# Patient Record
Sex: Male | Born: 1975 | Race: Black or African American | Hispanic: No | Smoking: Never smoker
Health system: Southern US, Community
[De-identification: ages and names within clinical notes are randomized; demographics above are authoritative.]

## PROBLEM LIST (undated history)

## (undated) DIAGNOSIS — E119 Type 2 diabetes mellitus without complications: Secondary | ICD-10-CM

---

## 2014-08-07 ENCOUNTER — Inpatient Hospital Stay (HOSPITAL_COMMUNITY): Payer: Non-veteran care

## 2014-08-07 ENCOUNTER — Emergency Department (HOSPITAL_COMMUNITY): Payer: Non-veteran care

## 2014-08-07 ENCOUNTER — Inpatient Hospital Stay (HOSPITAL_COMMUNITY)
Admission: EM | Admit: 2014-08-07 | Discharge: 2014-08-15 | DRG: 086 | Disposition: A | Discharge status: Rehabilitation Facility | Payer: Non-veteran care | Attending: General Surgery | Admitting: General Surgery

## 2014-08-07 ENCOUNTER — Encounter (HOSPITAL_COMMUNITY): Payer: Self-pay | Admitting: Emergency Medicine

## 2014-08-07 DIAGNOSIS — T796XXD Traumatic ischemia of muscle, subsequent encounter: Secondary | ICD-10-CM

## 2014-08-07 DIAGNOSIS — E10649 Type 1 diabetes mellitus with hypoglycemia without coma: Secondary | ICD-10-CM | POA: Diagnosis present

## 2014-08-07 DIAGNOSIS — M6282 Rhabdomyolysis: Secondary | ICD-10-CM | POA: Diagnosis present

## 2014-08-07 DIAGNOSIS — R402143 Coma scale, eyes open, spontaneous, at hospital admission: Secondary | ICD-10-CM | POA: Diagnosis present

## 2014-08-07 DIAGNOSIS — S065X9A Traumatic subdural hemorrhage with loss of consciousness of unspecified duration, initial encounter: Secondary | ICD-10-CM | POA: Diagnosis not present

## 2014-08-07 DIAGNOSIS — S069X4S Unspecified intracranial injury with loss of consciousness of 6 hours to 24 hours, sequela: Secondary | ICD-10-CM | POA: Diagnosis not present

## 2014-08-07 DIAGNOSIS — Y900 Blood alcohol level of less than 20 mg/100 ml: Secondary | ICD-10-CM | POA: Diagnosis present

## 2014-08-07 DIAGNOSIS — I619 Nontraumatic intracerebral hemorrhage, unspecified: Secondary | ICD-10-CM | POA: Diagnosis not present

## 2014-08-07 DIAGNOSIS — E109 Type 1 diabetes mellitus without complications: Secondary | ICD-10-CM

## 2014-08-07 DIAGNOSIS — S065XAA Traumatic subdural hemorrhage with loss of consciousness status unknown, initial encounter: Secondary | ICD-10-CM | POA: Diagnosis not present

## 2014-08-07 DIAGNOSIS — Z781 Physical restraint status: Secondary | ICD-10-CM | POA: Diagnosis not present

## 2014-08-07 DIAGNOSIS — H748X1 Other specified disorders of right middle ear and mastoid: Secondary | ICD-10-CM

## 2014-08-07 DIAGNOSIS — S0210XA Unspecified fracture of base of skull, initial encounter for closed fracture: Secondary | ICD-10-CM

## 2014-08-07 DIAGNOSIS — S0291XA Unspecified fracture of skull, initial encounter for closed fracture: Secondary | ICD-10-CM | POA: Diagnosis present

## 2014-08-07 DIAGNOSIS — E876 Hypokalemia: Secondary | ICD-10-CM | POA: Diagnosis present

## 2014-08-07 DIAGNOSIS — R32 Unspecified urinary incontinence: Secondary | ICD-10-CM | POA: Diagnosis present

## 2014-08-07 DIAGNOSIS — E872 Acidosis, unspecified: Secondary | ICD-10-CM

## 2014-08-07 DIAGNOSIS — R40243 Glasgow coma scale score 3-8: Secondary | ICD-10-CM | POA: Diagnosis present

## 2014-08-07 DIAGNOSIS — S066X1A Traumatic subarachnoid hemorrhage with loss of consciousness of 30 minutes or less, initial encounter: Secondary | ICD-10-CM | POA: Diagnosis present

## 2014-08-07 DIAGNOSIS — S069X4A Unspecified intracranial injury with loss of consciousness of 6 hours to 24 hours, initial encounter: Secondary | ICD-10-CM | POA: Diagnosis not present

## 2014-08-07 DIAGNOSIS — Z794 Long term (current) use of insulin: Secondary | ICD-10-CM | POA: Diagnosis not present

## 2014-08-07 DIAGNOSIS — S069X9A Unspecified intracranial injury with loss of consciousness of unspecified duration, initial encounter: Secondary | ICD-10-CM | POA: Diagnosis present

## 2014-08-07 DIAGNOSIS — S060X1A Concussion with loss of consciousness of 30 minutes or less, initial encounter: Secondary | ICD-10-CM

## 2014-08-07 DIAGNOSIS — E119 Type 2 diabetes mellitus without complications: Secondary | ICD-10-CM | POA: Diagnosis not present

## 2014-08-07 DIAGNOSIS — S0210XS Unspecified fracture of base of skull, sequela: Secondary | ICD-10-CM | POA: Diagnosis not present

## 2014-08-07 DIAGNOSIS — R402213 Coma scale, best verbal response, none, at hospital admission: Secondary | ICD-10-CM | POA: Diagnosis present

## 2014-08-07 DIAGNOSIS — F1290 Cannabis use, unspecified, uncomplicated: Secondary | ICD-10-CM | POA: Diagnosis present

## 2014-08-07 DIAGNOSIS — S02109A Fracture of base of skull, unspecified side, initial encounter for closed fracture: Secondary | ICD-10-CM | POA: Diagnosis present

## 2014-08-07 DIAGNOSIS — S0003XA Contusion of scalp, initial encounter: Secondary | ICD-10-CM | POA: Diagnosis present

## 2014-08-07 DIAGNOSIS — D62 Acute posthemorrhagic anemia: Secondary | ICD-10-CM | POA: Diagnosis present

## 2014-08-07 DIAGNOSIS — F1010 Alcohol abuse, uncomplicated: Secondary | ICD-10-CM | POA: Diagnosis present

## 2014-08-07 DIAGNOSIS — M5416 Radiculopathy, lumbar region: Secondary | ICD-10-CM | POA: Diagnosis not present

## 2014-08-07 DIAGNOSIS — S069XAA Unspecified intracranial injury with loss of consciousness status unknown, initial encounter: Secondary | ICD-10-CM | POA: Diagnosis present

## 2014-08-07 DIAGNOSIS — Z23 Encounter for immunization: Secondary | ICD-10-CM

## 2014-08-07 HISTORY — DX: Type 2 diabetes mellitus without complications: E11.9

## 2014-08-07 LAB — COMPREHENSIVE METABOLIC PANEL
ALT: 23 U/L (ref 0–53)
ANION GAP: 15 (ref 5–15)
AST: 29 U/L (ref 0–37)
Albumin: 4.2 g/dL (ref 3.5–5.2)
Alkaline Phosphatase: 86 U/L (ref 39–117)
BILIRUBIN TOTAL: 0.8 mg/dL (ref 0.3–1.2)
BUN: 17 mg/dL (ref 6–23)
CHLORIDE: 96 meq/L (ref 96–112)
CO2: 26 meq/L (ref 19–32)
Calcium: 9.2 mg/dL (ref 8.4–10.5)
Creatinine, Ser: 1.24 mg/dL (ref 0.50–1.35)
GFR, EST AFRICAN AMERICAN: 84 mL/min — AB (ref >90)
GFR, EST NON AFRICAN AMERICAN: 73 mL/min — AB (ref >90)
GLUCOSE: 262 mg/dL — AB (ref 70–99)
POTASSIUM: 3.4 meq/L — AB (ref 3.7–5.3)
Sodium: 137 mEq/L (ref 137–147)
Total Protein: 7.5 g/dL (ref 6.0–8.3)

## 2014-08-07 LAB — I-STAT CHEM 8, ED
BUN: 17 mg/dL (ref 6–23)
CHLORIDE: 100 meq/L (ref 96–112)
Calcium, Ion: 1.18 mmol/L (ref 1.12–1.23)
Creatinine, Ser: 1.3 mg/dL (ref 0.50–1.35)
GLUCOSE: 264 mg/dL — AB (ref 70–99)
HEMATOCRIT: 41 % (ref 39.0–52.0)
Hemoglobin: 13.9 g/dL (ref 13.0–17.0)
POTASSIUM: 3.3 meq/L — AB (ref 3.7–5.3)
Sodium: 138 mEq/L (ref 137–147)
TCO2: 23 mmol/L (ref 0–100)

## 2014-08-07 LAB — PROTIME-INR
INR: 1.14 (ref 0.00–1.49)
PROTHROMBIN TIME: 14.6 s (ref 11.6–15.2)

## 2014-08-07 LAB — CBC
HCT: 34.2 % — ABNORMAL LOW (ref 39.0–52.0)
HCT: 33.4 % — ABNORMAL LOW (ref 39.0–52.0)
HEMOGLOBIN: 11.3 g/dL — AB (ref 13.0–17.0)
Hemoglobin: 11.5 g/dL — ABNORMAL LOW (ref 13.0–17.0)
MCH: 24.2 pg — ABNORMAL LOW (ref 26.0–34.0)
MCH: 24.5 pg — AB (ref 26.0–34.0)
MCHC: 33.6 g/dL (ref 30.0–36.0)
MCHC: 33.8 g/dL (ref 30.0–36.0)
MCV: 72 fL — ABNORMAL LOW (ref 78.0–100.0)
MCV: 72.3 fL — ABNORMAL LOW (ref 78.0–100.0)
PLATELETS: 256 10*3/uL (ref 150–400)
Platelets: 273 10*3/uL (ref 150–400)
RBC: 4.75 MIL/uL (ref 4.22–5.81)
RBC: 4.62 MIL/uL (ref 4.22–5.81)
RDW: 13 % (ref 11.5–15.5)
RDW: 13 % (ref 11.5–15.5)
WBC: 14.2 10*3/uL — ABNORMAL HIGH (ref 4.0–10.5)
WBC: 16.8 10*3/uL — AB (ref 4.0–10.5)

## 2014-08-07 LAB — BASIC METABOLIC PANEL
ANION GAP: 17 — AB (ref 5–15)
BUN: 14 mg/dL (ref 6–23)
CHLORIDE: 96 meq/L (ref 96–112)
CO2: 25 meq/L (ref 19–32)
Calcium: 9.2 mg/dL (ref 8.4–10.5)
Creatinine, Ser: 0.89 mg/dL (ref 0.50–1.35)
GFR calc Af Amer: >90 mL/min (ref >90)
GFR calc non Af Amer: >90 mL/min (ref >90)
GLUCOSE: 97 mg/dL (ref 70–99)
POTASSIUM: 3.2 meq/L — AB (ref 3.7–5.3)
SODIUM: 138 meq/L (ref 137–147)

## 2014-08-07 LAB — CBG MONITORING, ED
GLUCOSE-CAPILLARY: 140 mg/dL — AB (ref 70–99)
Glucose-Capillary: 224 mg/dL — ABNORMAL HIGH (ref 70–99)
Glucose-Capillary: 277 mg/dL — ABNORMAL HIGH (ref 70–99)
Glucose-Capillary: 273 mg/dL — ABNORMAL HIGH (ref 70–99)

## 2014-08-07 LAB — TSH: TSH: 0.615 u[IU]/mL (ref 0.350–4.500)

## 2014-08-07 LAB — GLUCOSE, CAPILLARY
GLUCOSE-CAPILLARY: 177 mg/dL — AB (ref 70–99)
GLUCOSE-CAPILLARY: 144 mg/dL — AB (ref 70–99)
Glucose-Capillary: 124 mg/dL — ABNORMAL HIGH (ref 70–99)

## 2014-08-07 LAB — LACTIC ACID, PLASMA: Lactic Acid, Venous: 5.7 mmol/L — ABNORMAL HIGH (ref 0.5–2.2)

## 2014-08-07 LAB — ETHANOL: ALCOHOL ETHYL (B): 16 mg/dL — AB (ref 0–11)

## 2014-08-07 LAB — I-STAT TROPONIN, ED: TROPONIN I, POC: 0.01 ng/mL (ref 0.00–0.08)

## 2014-08-07 LAB — CK: CK TOTAL: 728 U/L — AB (ref 7–232)

## 2014-08-07 LAB — MAGNESIUM: MAGNESIUM: 1.6 mg/dL (ref 1.5–2.5)

## 2014-08-07 LAB — I-STAT CG4 LACTIC ACID, ED: LACTIC ACID, VENOUS: 3.66 mmol/L — AB (ref 0.5–2.2)

## 2014-08-07 LAB — SAMPLE TO BLOOD BANK

## 2014-08-07 LAB — CDS SEROLOGY

## 2014-08-07 LAB — MRSA PCR SCREENING: MRSA BY PCR: NEGATIVE

## 2014-08-07 MED ORDER — ONDANSETRON HCL 4 MG/2ML IJ SOLN
INTRAMUSCULAR | Status: AC
  Administered 2014-08-07: 4 mg
  Filled 2014-08-07: qty 2

## 2014-08-07 MED ORDER — ONDANSETRON HCL 4 MG/2ML IJ SOLN
4.0000 mg | Freq: Four times a day (QID) | INTRAMUSCULAR | Status: DC | PRN
  Administered 2014-08-07: 4 mg via INTRAVENOUS
  Filled 2014-08-07: qty 2

## 2014-08-07 MED ORDER — DEXTROSE 50 % IV SOLN: 50.0000 mL | Freq: Once | INTRAVENOUS | Status: AC | PRN

## 2014-08-07 MED ORDER — PANTOPRAZOLE SODIUM 40 MG PO TBEC: 40.0000 mg | DELAYED_RELEASE_TABLET | Freq: Every day | ORAL | Status: DC

## 2014-08-07 MED ORDER — LORAZEPAM 2 MG/ML IJ SOLN
1.0000 mg | INTRAMUSCULAR | Status: DC | PRN
  Administered 2014-08-07 (×2): 1 mg via INTRAVENOUS
  Filled 2014-08-07 (×2): qty 1

## 2014-08-07 MED ORDER — INSULIN ASPART 100 UNIT/ML ~~LOC~~ SOLN
0.0000 [IU] | SUBCUTANEOUS | Status: DC
  Administered 2014-08-07: 2 [IU] via SUBCUTANEOUS
  Administered 2014-08-07: 8 [IU] via SUBCUTANEOUS
  Filled 2014-08-07 (×2): qty 1

## 2014-08-07 MED ORDER — POTASSIUM CHLORIDE 2 MEQ/ML IV SOLN
INTRAVENOUS | Status: DC
  Administered 2014-08-07 – 2014-08-10 (×3): via INTRAVENOUS
  Filled 2014-08-07 (×7): qty 1000

## 2014-08-07 MED ORDER — LORAZEPAM 1 MG PO TABS: 1.0000 mg | ORAL_TABLET | Freq: Four times a day (QID) | ORAL | Status: AC | PRN

## 2014-08-07 MED ORDER — LORAZEPAM 2 MG/ML IJ SOLN
1.0000 mg | INTRAMUSCULAR | Status: DC | PRN
  Administered 2014-08-13 – 2014-08-15 (×4): 1 mg via INTRAVENOUS
  Filled 2014-08-07 (×4): qty 1

## 2014-08-07 MED ORDER — VITAMIN B-1 100 MG PO TABS
100.0000 mg | ORAL_TABLET | Freq: Every day | ORAL | Status: DC
  Administered 2014-08-10 – 2014-08-15 (×6): 100 mg via ORAL
  Filled 2014-08-07 (×9): qty 1

## 2014-08-07 MED ORDER — FOLIC ACID 5 MG/ML IJ SOLN
1.0000 mg | Freq: Every day | INTRAMUSCULAR | Status: DC
  Administered 2014-08-07 – 2014-08-09 (×3): 1 mg via INTRAVENOUS
  Filled 2014-08-07 (×4): qty 0.2

## 2014-08-07 MED ORDER — PANTOPRAZOLE SODIUM 40 MG IV SOLR
40.0000 mg | Freq: Every day | INTRAVENOUS | Status: DC
  Administered 2014-08-07 – 2014-08-08 (×2): 40 mg via INTRAVENOUS
  Filled 2014-08-07 (×3): qty 40

## 2014-08-07 MED ORDER — LORAZEPAM 2 MG/ML IJ SOLN: 1.0000 mg | Freq: Four times a day (QID) | INTRAMUSCULAR | Status: AC | PRN

## 2014-08-07 MED ORDER — CIPROFLOXACIN-HYDROCORTISONE 0.2-1 % OT SUSP
3.0000 [drp] | Freq: Two times a day (BID) | OTIC | Status: DC
  Administered 2014-08-07 – 2014-08-14 (×12): 3 [drp] via OTIC
  Filled 2014-08-07 (×2): qty 10

## 2014-08-07 MED ORDER — THIAMINE HCL 100 MG/ML IJ SOLN
100.0000 mg | Freq: Every day | INTRAMUSCULAR | Status: DC
  Administered 2014-08-07 – 2014-08-08 (×2): 100 mg via INTRAVENOUS
  Filled 2014-08-07 (×4): qty 1

## 2014-08-07 MED ORDER — INSULIN DETEMIR 100 UNIT/ML ~~LOC~~ SOLN
8.0000 [IU] | Freq: Every day | SUBCUTANEOUS | Status: DC
  Administered 2014-08-07: 8 [IU] via SUBCUTANEOUS
  Filled 2014-08-07 (×2): qty 0.08

## 2014-08-07 MED ORDER — FENTANYL CITRATE 0.05 MG/ML IJ SOLN
INTRAMUSCULAR | Status: AC
  Administered 2014-08-07: 100 ug
  Filled 2014-08-07: qty 2

## 2014-08-07 MED ORDER — ONDANSETRON HCL 4 MG PO TABS: 4.0000 mg | ORAL_TABLET | Freq: Four times a day (QID) | ORAL | Status: DC | PRN

## 2014-08-07 MED ORDER — INSULIN ASPART 100 UNIT/ML ~~LOC~~ SOLN: 0.0000 [IU] | Freq: Three times a day (TID) | SUBCUTANEOUS | Status: DC

## 2014-08-07 MED ORDER — DEXTROSE 50 % IV SOLN: 25.0000 mL | Freq: Once | INTRAVENOUS | Status: AC | PRN

## 2014-08-07 MED ORDER — FENTANYL CITRATE 0.05 MG/ML IJ SOLN
50.0000 ug | INTRAMUSCULAR | Status: DC | PRN
  Administered 2014-08-08 – 2014-08-09 (×2): 50 ug via INTRAVENOUS
  Filled 2014-08-07 (×2): qty 2

## 2014-08-07 MED ORDER — POTASSIUM CHLORIDE IN NACL 20-0.9 MEQ/L-% IV SOLN
INTRAVENOUS | Status: DC
  Administered 2014-08-07: 09:00:00 via INTRAVENOUS
  Filled 2014-08-07 (×4): qty 1000

## 2014-08-07 MED ORDER — INSULIN ASPART 100 UNIT/ML ~~LOC~~ SOLN
0.0000 [IU] | SUBCUTANEOUS | Status: DC
  Administered 2014-08-07: 2 [IU] via SUBCUTANEOUS
  Administered 2014-08-07: 3 [IU] via SUBCUTANEOUS
  Administered 2014-08-08: 5 [IU] via SUBCUTANEOUS
  Administered 2014-08-08: 3 [IU] via SUBCUTANEOUS
  Administered 2014-08-08: 5 [IU] via SUBCUTANEOUS
  Administered 2014-08-08: 3 [IU] via SUBCUTANEOUS
  Administered 2014-08-08: 2 [IU] via SUBCUTANEOUS
  Administered 2014-08-09 (×2): 3 [IU] via SUBCUTANEOUS
  Administered 2014-08-09: 2 [IU] via SUBCUTANEOUS
  Administered 2014-08-09: 5 [IU] via SUBCUTANEOUS
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
## 2014-08-07 NOTE — ED Notes (Signed)
Pt is resting comfortably in the bed in side lying position. Has safety sitter at bedside. Waiting for bed assignment to floor.
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
## 2014-08-07 NOTE — ED Notes (Signed)
Pt mother arrived at bedside. Paged admitting MD for orders for increasing anxiety, agitation, trying to get out of bed.

## 2014-08-07 NOTE — ED Notes (Signed)
Patient sitting up in the bed and acting as if he is going to vomit.  Emesis bag held for the patient and he vomited a small amount  Instructed to lay back in the bed and followed directions

## 2014-08-07 NOTE — ED Notes (Signed)
valuables with security

## 2014-08-07 NOTE — ED Notes (Signed)
Pt agitated, trying to get out of bed, not following staff commands.

## 2014-08-07 NOTE — ED Notes (Signed)
Soft wrist restraints removed.
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
## 2014-08-07 NOTE — ED Notes (Signed)
Dr Danielle Dess in to see patient.  Stated he is acting like someone with a basilar skull fracture.  Explained that it is common for a patient with a basilar skull fracture to act the way he is acting and common for the pulse to be low

## 2014-08-07 NOTE — Progress Notes (Signed)
I have seen and examined the pt and agree with PA-Jenning's progress note.  

## 2014-08-07 NOTE — ED Notes (Signed)
Pt is sleeping, lying on right side. Spontaneously moves from side to side. Warm blankets on patient. Waiting for bed assignment.

## 2014-08-07 NOTE — Progress Notes (Signed)
Patient's agitation increases when patient has the urge to urinate.  Patient was able to move to the end of the bed to an almost upright position (with coordinated movements) very quickly.  During 6PM neuro check, patient was able to follow commands (wiggle toes and stop when asked).  Patient has had no more episodes of emesis after the Zofran.
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
## 2014-08-07 NOTE — Progress Notes (Signed)
Received a STAT CT order for Head CT from Dr. Janee Morn. Paged trauma MD on Call, Dr. Derrell Lolling and explained patient had just undergone CT head @ ~ 0400 and Dr. Carollee Massed note indicated follow up CT head tomorrow morning.  Dr. Derrell Lolling gave telephone order to change order from Stat to tomorrow morning

## 2014-08-07 NOTE — Progress Notes (Signed)
Pt began vomiting at 1620. No changes in neuro exam. Dr. Wynetta Emery with Neurosurgery and Sherrie George, [place] of Trauma notified. 4 mg Zofran given. Order for a follow up CT scan this evening. Will continue to monitor patient and update as needed.
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
## 2014-08-08 DIAGNOSIS — S069XAA Unspecified intracranial injury with loss of consciousness status unknown, initial encounter: Secondary | ICD-10-CM | POA: Diagnosis present

## 2014-08-08 DIAGNOSIS — T796XXA Traumatic ischemia of muscle, initial encounter: Secondary | ICD-10-CM

## 2014-08-08 DIAGNOSIS — E119 Type 2 diabetes mellitus without complications: Secondary | ICD-10-CM

## 2014-08-08 DIAGNOSIS — D62 Acute posthemorrhagic anemia: Secondary | ICD-10-CM | POA: Diagnosis present

## 2014-08-08 DIAGNOSIS — S069X9A Unspecified intracranial injury with loss of consciousness of unspecified duration, initial encounter: Secondary | ICD-10-CM | POA: Diagnosis present

## 2014-08-08 LAB — BASIC METABOLIC PANEL
ANION GAP: 13 (ref 5–15)
BUN: 13 mg/dL (ref 6–23)
CALCIUM: 9 mg/dL (ref 8.4–10.5)
CHLORIDE: 100 meq/L (ref 96–112)
CO2: 25 mEq/L (ref 19–32)
Creatinine, Ser: 0.89 mg/dL (ref 0.50–1.35)
GFR calc Af Amer: >90 mL/min (ref >90)
GFR calc non Af Amer: >90 mL/min (ref >90)
Glucose, Bld: 111 mg/dL — ABNORMAL HIGH (ref 70–99)
Potassium: 3.8 mEq/L (ref 3.7–5.3)
SODIUM: 138 meq/L (ref 137–147)

## 2014-08-08 LAB — GLUCOSE, CAPILLARY
GLUCOSE-CAPILLARY: 182 mg/dL — AB (ref 70–99)
GLUCOSE-CAPILLARY: 113 mg/dL — AB (ref 70–99)
GLUCOSE-CAPILLARY: 167 mg/dL — AB (ref 70–99)
GLUCOSE-CAPILLARY: 148 mg/dL — AB (ref 70–99)
GLUCOSE-CAPILLARY: 189 mg/dL — AB (ref 70–99)
Glucose-Capillary: 231 mg/dL — ABNORMAL HIGH (ref 70–99)
Glucose-Capillary: 186 mg/dL — ABNORMAL HIGH (ref 70–99)
Glucose-Capillary: 247 mg/dL — ABNORMAL HIGH (ref 70–99)

## 2014-08-08 LAB — HEMOGLOBIN A1C
HEMOGLOBIN A1C: 8.4 % — AB (ref <5.7)
MEAN PLASMA GLUCOSE: 194 mg/dL — AB (ref <117)

## 2014-08-08 LAB — LACTIC ACID, PLASMA: Lactic Acid, Venous: 1.6 mmol/L (ref 0.5–2.2)

## 2014-08-08 LAB — CBC
HCT: 35.9 % — ABNORMAL LOW (ref 39.0–52.0)
Hemoglobin: 11.9 g/dL — ABNORMAL LOW (ref 13.0–17.0)
MCH: 24.2 pg — ABNORMAL LOW (ref 26.0–34.0)
MCHC: 33.1 g/dL (ref 30.0–36.0)
MCV: 73 fL — ABNORMAL LOW (ref 78.0–100.0)
PLATELETS: 266 10*3/uL (ref 150–400)
RBC: 4.92 MIL/uL (ref 4.22–5.81)
RDW: 13 % (ref 11.5–15.5)
WBC: 19 10*3/uL — AB (ref 4.0–10.5)

## 2014-08-08 MED ORDER — INSULIN DETEMIR 100 UNIT/ML ~~LOC~~ SOLN
12.0000 [IU] | Freq: Every day | SUBCUTANEOUS | Status: DC
  Administered 2014-08-08 – 2014-08-10 (×3): 12 [IU] via SUBCUTANEOUS
  Filled 2014-08-08 (×4): qty 0.12

## 2014-08-08 MED ORDER — DOCUSATE SODIUM 100 MG PO CAPS
100.0000 mg | ORAL_CAPSULE | Freq: Two times a day (BID) | ORAL | Status: DC
  Administered 2014-08-10 – 2014-08-15 (×9): 100 mg via ORAL
  Filled 2014-08-08 (×16): qty 1

## 2014-08-08 MED ORDER — POLYETHYLENE GLYCOL 3350 17 G PO PACK
17.0000 g | PACK | Freq: Every day | ORAL | Status: DC
  Administered 2014-08-09 – 2014-08-14 (×6): 17 g via ORAL
  Filled 2014-08-08 (×8): qty 1
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
## 2014-08-08 NOTE — Progress Notes (Signed)
Patient ID: [name], male   DOB: May 07, 1976, 38 y.o.   MRN: [id number] Vital signs are stable. Patient remains lethargic and confused impulsive and agitated Continues to move all 4 extremities Repeat CT scan demonstrates evolution of bifrontal basilar hemorrhagic contusions. Clinically he has a basilar skull fracture with the right otorrhea. Continue observation.
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
## 2014-08-08 NOTE — Progress Notes (Signed)
Inpatient Diabetes Program Recommendations  AACE/ADA: New Consensus Statement on Inpatient Glycemic Control (2013)  Target Ranges:  Prepandial:   less than 140 mg/dL      Peak postprandial:   less than 180 mg/dL (1-2 hours)      Critically ill patients:  140 - 180 mg/dL   Reason for Assessment: Hyperglycemia and Elevated HgbA1C  Diabetes history: DM1 Outpatient Diabetes medications: Lantus 20 units QHS and Novolog s/s Current orders for Inpatient glycemic control: Levemir 12 units QHS and Novolog moderate Q4H  Results for TONYA, CARLILE (MRN [id number]) as of 08/08/2014 14:10  Ref. Range 08/07/2014 13:22  Hemoglobin A1C Latest Range: <5.7 % 8.4 (H)  Results for RIGGS, DINEEN (MRN [id number]) as of 08/08/2014 14:10  Ref. Range 08/08/2014 02:49 08/08/2014 07:46 08/08/2014 09:47 08/08/2014 11:10  Glucose-Capillary Latest Range: 70-99 mg/dL 086 (H) 578 (H) 469 (H) 186 (H)    Inpatient Diabetes Program Recommendations Correction (SSI): Novolog moderate Q4H - advance to tidwc and hs when pt is eating CHO mod diet Insulin - Meal Coverage: Will need meal coverage insulin when diet is advanced to CHO mod med. On CL at present. HgbA1C: 8.4% - sub-optimal control Diet: When advanced, CHO mod med  Note: May need to decrease Novolog to sensitive since pt is Type 1. To discuss HgbA1C with pt when pt more oriented.  Will continue to follow. Thank you. Ailene Ards, RD, LDN, CDE Inpatient Diabetes Coordinator [phone]

## 2014-08-08 NOTE — Progress Notes (Signed)
Patient still very sleepy.  Will re-evaluate later today. \ This patient has been seen and I agree with the findings and treatment plan.  Marta Lamas. Gae Bon, MD, FACS [phone] (pager) [phone] (direct pager) Trauma Surgeon

## 2014-08-09 LAB — GLUCOSE, CAPILLARY
GLUCOSE-CAPILLARY: 78 mg/dL (ref 70–99)
GLUCOSE-CAPILLARY: 169 mg/dL — AB (ref 70–99)
GLUCOSE-CAPILLARY: 212 mg/dL — AB (ref 70–99)
Glucose-Capillary: 145 mg/dL — ABNORMAL HIGH (ref 70–99)
Glucose-Capillary: 246 mg/dL — ABNORMAL HIGH (ref 70–99)

## 2014-08-09 LAB — DRUGS OF ABUSE SCREEN W/O ALC, ROUTINE URINE
Amphetamine Screen, Ur: NEGATIVE
Barbiturate Quant, Ur: NEGATIVE
Benzodiazepines.: NEGATIVE
Cocaine Metabolites: NEGATIVE
Creatinine,U: 150.6 mg/dL
MARIJUANA METABOLITE: POSITIVE — AB
Methadone: NEGATIVE
Opiate Screen, Urine: NEGATIVE
PROPOXYPHENE: NEGATIVE
Phencyclidine (PCP): NEGATIVE

## 2014-08-09 MED ORDER — INSULIN ASPART 100 UNIT/ML ~~LOC~~ SOLN: 0.0000 [IU] | Freq: Three times a day (TID) | SUBCUTANEOUS | Status: DC

## 2014-08-09 MED ORDER — TRAMADOL HCL 50 MG PO TABS
50.0000 mg | ORAL_TABLET | Freq: Four times a day (QID) | ORAL | Status: DC | PRN
  Administered 2014-08-09 (×2): 100 mg via ORAL
  Administered 2014-08-10: 50 mg via ORAL
  Administered 2014-08-10 – 2014-08-11 (×4): 100 mg via ORAL
  Administered 2014-08-12: 50 mg via ORAL
  Administered 2014-08-13: 100 mg via ORAL
  Filled 2014-08-09: qty 1
  Filled 2014-08-09 (×4): qty 2
  Filled 2014-08-09 (×2): qty 1
  Filled 2014-08-09 (×2): qty 2
  Filled 2014-08-09: qty 1

## 2014-08-09 MED ORDER — HYDROMORPHONE HCL 1 MG/ML IJ SOLN
0.5000 mg | INTRAMUSCULAR | Status: DC | PRN
  Administered 2014-08-09 – 2014-08-15 (×14): 0.5 mg via INTRAVENOUS
  Filled 2014-08-09 (×16): qty 1

## 2014-08-09 MED ORDER — INSULIN ASPART 100 UNIT/ML ~~LOC~~ SOLN
0.0000 [IU] | Freq: Three times a day (TID) | SUBCUTANEOUS | Status: DC
  Administered 2014-08-09 – 2014-08-10 (×4): 5 [IU] via SUBCUTANEOUS
  Administered 2014-08-10: 3 [IU] via SUBCUTANEOUS
  Administered 2014-08-11: 8 [IU] via SUBCUTANEOUS
  Administered 2014-08-11 (×3): 5 [IU] via SUBCUTANEOUS
  Administered 2014-08-12: 3 [IU] via SUBCUTANEOUS
  Administered 2014-08-12: 18:00:00 via SUBCUTANEOUS
  Administered 2014-08-12 (×2): 8 [IU] via SUBCUTANEOUS
  Administered 2014-08-13: 15 [IU] via SUBCUTANEOUS
  Administered 2014-08-13: 5 [IU] via SUBCUTANEOUS

## 2014-08-09 MED ORDER — WHITE PETROLATUM GEL
Status: AC
  Administered 2014-08-09: 13:00:00
  Filled 2014-08-09: qty 5
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
## 2014-08-09 NOTE — Progress Notes (Signed)
Patient very belligerent.  Sister very concerned.  Wants him to go to a rehab program.  Patient probably would not agree.  This patient has been seen and I agree with the findings and treatment plan.  Marta LamasJames O. Gae BonWyatt, III, MD, FACS [phone] (pager) [phone] (direct pager) Trauma Surgeon
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
## 2014-08-09 NOTE — Progress Notes (Signed)
Rehab Admissions Coordinator Note:  Patient was screened by Trish MageLogue, Renesmay Nesbitt M for appropriateness for an Inpatient Acute Rehab Consult.  At this time, we are recommending Inpatient Rehab consult.  Trish MageLogue, Anja Neuzil M 08/09/2014, 12:25 PM  I can be reached at [phone].
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
## 2014-08-10 ENCOUNTER — Inpatient Hospital Stay (HOSPITAL_COMMUNITY): Payer: Non-veteran care

## 2014-08-10 DIAGNOSIS — I619 Nontraumatic intracerebral hemorrhage, unspecified: Secondary | ICD-10-CM

## 2014-08-10 DIAGNOSIS — S065XAA Traumatic subdural hemorrhage with loss of consciousness status unknown, initial encounter: Secondary | ICD-10-CM

## 2014-08-10 DIAGNOSIS — S065X9A Traumatic subdural hemorrhage with loss of consciousness of unspecified duration, initial encounter: Secondary | ICD-10-CM

## 2014-08-10 LAB — GLUCOSE, CAPILLARY
GLUCOSE-CAPILLARY: 244 mg/dL — AB (ref 70–99)
GLUCOSE-CAPILLARY: 166 mg/dL — AB (ref 70–99)
Glucose-Capillary: 211 mg/dL — ABNORMAL HIGH (ref 70–99)
Glucose-Capillary: 218 mg/dL — ABNORMAL HIGH (ref 70–99)
Glucose-Capillary: 172 mg/dL — ABNORMAL HIGH (ref 70–99)

## 2014-08-10 MED ORDER — FOLIC ACID 1 MG PO TABS
1.0000 mg | ORAL_TABLET | Freq: Every day | ORAL | Status: DC
Start: 2014-08-10 — End: 2014-08-15
  Administered 2014-08-10 – 2014-08-15 (×6): 1 mg via ORAL
  Filled 2014-08-10 (×6): qty 1

## 2014-08-10 NOTE — Progress Notes (Signed)
Inpatient Diabetes Program Recommendations  AACE/ADA: New Consensus Statement on Inpatient Glycemic Control (2013)  Target Ranges:  Prepandial:   less than 140 mg/dL      Peak postprandial:   less than 180 mg/dL (1-2 hours)      Critically ill patients:  140 - 180 mg/dL   Reason for Assessment: Type 1 diabetes with glycemic control issues  Diabetes history: Type 1 Outpatient Diabetes medications: Lantus 20 units at HS, Novolog correction tid Current orders for Inpatient glycemic control: Levemir 12 units at HS, Novolog moderate correction scale tid  Results for [name], [name] (MRN [id number]) as of 08/10/2014 14:52  Ref. Range 08/09/2014 07:42 08/09/2014 11:14 08/09/2014 15:26 08/09/2014 22:04 08/10/2014 07:43 08/10/2014 11:16  Glucose-Capillary Latest Range: 70-99 mg/dL 78 409169 (H) 811212 (H) 914246 (H) 244 (H) 211 (H)   Note:  Beginning to eat some.  20% intake charted.  CBG 244 this am before breakfast, which indicates that he may benefit from increase in basal dosage.  Still agitated and uncooperative.    Recommendations: May benefit from increase in Levemir to 15 units.    Since patient has type 1 diabetes, will eventually need meal coverage.  Once patient is eating at least 50%, consider adding Novolog 3 units as meal coverage tid with meals to be given in addition to correction-- provided patient eats at least 50% and CBG > 80 mg/dl and change correction scale from moderate to sensitive once meal coverage started.  Thank you.  Ved Martos S. Elsie Lincolnouth, RN, CNS, CDE Inpatient Diabetes Program, team pager [phone]
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
## 2014-08-10 NOTE — Progress Notes (Signed)
Report received from nurse on 12M. All questions answered. Pt coming to 4N06 via wheelchair.
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
## 2014-08-10 NOTE — Clinical Social Work Note (Signed)
Clinical Social Work Department BRIEF PSYCHOSOCIAL ASSESSMENT 08/10/2014  Patient:  ANTHONEY, [name]     Account Number:  [id number]     [place] date:  08/07/2014  Clinical Social Worker:  Myles Lipps  Date/Time:  08/10/2014 11:30 AM  Referred by:  Physician  Date Referred:  08/10/2014 Referred for  Substance Abuse  Psychosocial assessment   Other Referral:   Interview type:  Patient Other interview type:   Spoke with patient [name] [phone]) over the phone    PSYCHOSOCIAL DATA Living Status:  ALONE Admitted from facility:   Level of care:   Primary support name:  [name] [phone] Primary support relationship to patient:  SIBLING Degree of support available:   Strong    CURRENT CONCERNS Current Concerns  Post-Acute Placement  Post-Acute Placement   Other Concerns:    SOCIAL WORK ASSESSMENT / PLAN Clinical Social Worker met with patient at bedside to offer support.  Patient remains very confused, stating that he was at [facility] and living with his cousin.  CSW spoke with patient sister over the phone who states that patient is currently living in a boarding house that his cousin also lives in, in [place].  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  CSW notified CM. Patient sister agreeable with rehab needs and states that patient has limited family support for 24/7.  CSW to follow up with patient sister regarding rehab options pending patient service connection with VA benefit.    Clinical Social Worker inquired about current substance use.  Patient states that he does not use drugs or alcohol, however patient confused.  An accurate SBIRT could not be completed at this time.  CSW to follow up with patient to complete SBIRT if mental status clears.  CSW remains available for support and to assist with patient discharge needs.   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  CSW has updated CM regarding contacting [facility].    PATIENT'S/FAMILY'S RESPONSE TO PLAN OF CARE: Patient alert to self only at this time.  Patient with good family support, however unable to provide 24/7 support. Patient will not be able to manage in the boarding house alone and per patient sister, patient cousin is not able to care for patient needs.  Patient sister understanding of CSW role and appreciative for support and involvement.

## 2014-08-10 NOTE — Progress Notes (Signed)
Transfer appropriate to the floor.  Patient still angry.  This patient has been seen and I agree with the findings and treatment plan.  Marta LamasJames O. Gae BonWyatt, III, MD, FACS [phone] (pager) [phone] (direct pager) Trauma Surgeon

## 2014-08-10 NOTE — Progress Notes (Signed)
Patient complains of lower back/sacral pain with standing and sitting upright. Physical therapy reported similar issues with ambulation around unit. Dr. Lindie SpruceWyatt was made aware and orders were entered for a portable pelvis x-ray. This information was relayed to the nurse receiving the patient on 4N, Colette RibasJulia Hurrelbrink, RN.

## 2014-08-11 ENCOUNTER — Inpatient Hospital Stay (HOSPITAL_COMMUNITY): Payer: Non-veteran care

## 2014-08-11 LAB — GLUCOSE, CAPILLARY
GLUCOSE-CAPILLARY: 259 mg/dL — AB (ref 70–99)
Glucose-Capillary: 203 mg/dL — ABNORMAL HIGH (ref 70–99)
Glucose-Capillary: 203 mg/dL — ABNORMAL HIGH (ref 70–99)
Glucose-Capillary: 226 mg/dL — ABNORMAL HIGH (ref 70–99)

## 2014-08-11 MED ORDER — INSULIN DETEMIR 100 UNIT/ML ~~LOC~~ SOLN
18.0000 [IU] | Freq: Every day | SUBCUTANEOUS | Status: DC
  Administered 2014-08-11 – 2014-08-14 (×4): 18 [IU] via SUBCUTANEOUS
  Filled 2014-08-11 (×5): qty 0.18

## 2014-08-11 MED ORDER — INFLUENZA VAC SPLIT QUAD 0.5 ML IM SUSY
0.5000 mL | PREFILLED_SYRINGE | INTRAMUSCULAR | Status: AC
  Administered 2014-08-12: 0.5 mL via INTRAMUSCULAR
  Filled 2014-08-11: qty 0.5

## 2014-08-11 NOTE — Progress Notes (Signed)
Rehab admissions - Evaluated for possible admission.  Noted patient has VA benefits.  [place] [facility] [place].  [facility] will not authorize or pay for acute inpatient rehab admission.  I have talked to the Child psychotherapistsocial worker and to the case manager.  Call me for questions.  [phone]

## 2014-08-11 NOTE — Progress Notes (Signed)
Inpatient Diabetes Program Recommendations  AACE/ADA: New Consensus Statement on Inpatient Glycemic Control (2013)  Target Ranges:  Prepandial:   less than 140 mg/dL      Peak postprandial:   less than 180 mg/dL (1-2 hours)      Critically ill patients:  140 - 180 mg/dL     Results for [name], [name] (MRN [id number]) as of 08/11/2014 07:08  Ref. Range 08/10/2014 11:16 08/10/2014 16:56 08/10/2014 19:07 08/10/2014 21:17 08/11/2014 06:53  Glucose-Capillary Latest Range: 70-99 mg/dL 034211 (H) 742218 (H) 595166 (H) 172 (H) 259 (H)   Reason for Assessment: Type 1 diabetes with glycemic control issues  Diabetes history: Type 1  Outpatient Diabetes medications: Lantus 20 units at HS, Novolog correction tid  Current orders for Inpatient glycemic control: Levemir 12 units at HS, Novolog moderate correction scale tid   Note:  Beginning to eat some. 20% intake charted. CBG 259 this am before breakfast, which indicates that he may benefit from increase in basal dosage.   Recommendations:  May benefit from increase in Levemir to 18 units.  Since patient has type 1 diabetes, will eventually need meal coverage. Once patient is eating at least 50%, consider adding Novolog 3 units as meal coverage tid with meals to be given in addition to correction-- provided patient eats at least 50% and CBG > 80 mg/dl and change correction scale from moderate to sensitive once meal coverage started.  Susette RacerJulie Catrina Fellenz, RN, BA, MHA, CDE Diabetes Coordinator Inpatient Diabetes Program  [phone] (Team Pager) [phone] Patrcia Dolly([place] Office) 08/11/2014 7:15 AM
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
## 2014-08-11 NOTE — Progress Notes (Signed)
Do not see much on his pelvic X-ray or CT  Awaiting MRI.  This patient has been seen and I agree with the findings and treatment plan.  Marta LamasJames O. Gae BonWyatt, III, MD, FACS [phone] (pager) [phone] (direct pager) Trauma Surgeon

## 2014-08-11 NOTE — Progress Notes (Signed)
OT Cancellation Note  Patient Details Name: Derek AmbleLarry [name] MRN: [id number] DOB: 1976-06-23   Cancelled Treatment:    Reason Eval/Treat Not Completed: Patient at procedure or test/ unavailable. Pt off the floor currently. Ot to follow acutely and check back as appropriate.  Harolyn RutherfordJones, Rever [place] B Pager: [phone]  08/11/2014, 11:12 AM

## 2014-08-12 DIAGNOSIS — M5416 Radiculopathy, lumbar region: Secondary | ICD-10-CM | POA: Diagnosis not present

## 2014-08-12 LAB — GLUCOSE, CAPILLARY
GLUCOSE-CAPILLARY: 297 mg/dL — AB (ref 70–99)
GLUCOSE-CAPILLARY: 258 mg/dL — AB (ref 70–99)
Glucose-Capillary: 174 mg/dL — ABNORMAL HIGH (ref 70–99)
Glucose-Capillary: 256 mg/dL — ABNORMAL HIGH (ref 70–99)

## 2014-08-12 LAB — THC (MARIJUANA), URINE, CONFIRMATION: Marijuana, Ur-Confirmation: 973 ng/mL — ABNORMAL HIGH (ref <5)

## 2014-08-12 MED ORDER — PREGABALIN 75 MG PO CAPS
75.0000 mg | ORAL_CAPSULE | Freq: Two times a day (BID) | ORAL | Status: DC
  Administered 2014-08-12 – 2014-08-15 (×7): 75 mg via ORAL
  Filled 2014-08-12 (×7): qty 1

## 2014-08-12 MED ORDER — HYDROMORPHONE HCL 1 MG/ML IJ SOLN
INTRAMUSCULAR | Status: AC
  Filled 2014-08-12: qty 1

## 2014-08-12 MED ORDER — HYDROMORPHONE HCL 1 MG/ML IJ SOLN
1.0000 mg | Freq: Once | INTRAMUSCULAR | Status: AC
  Administered 2014-08-12: 1 mg via INTRAMUSCULAR
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
## 2014-08-12 NOTE — Clinical Social Work Note (Signed)
CSW attempted to call pt's mother, [name], regarding SNF placement at time of discharge. CSW has left message with pt's mother. CSW to continue to follow and assist with discharge planning needs.  Marcelline Deistmily Shelley Cocke, LCSWA [phone]) Licensed Clinical Social Worker Neuroscience [phone]([id number]) and Medical ICU (53M)
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
## 2014-08-12 NOTE — Progress Notes (Signed)
Rehab admissions - Noted patient not eligible for [facility].  I met with patient, but he is not aware enough for me to discuss options with him.  Sitter is at the bedside.  I called patient's mother and left a message.  I left booklets in the room about inpatient rehab.  I updated Education officer, museum.  I will talk with family as able and discuss rehab versus SNF placement.  Call me for questions.  #[phone]

## 2014-08-12 NOTE — Progress Notes (Signed)
Patient ID: Derek AmbleLarry Buswell, male   DOB: 26-May-1976, 38 y.o.   MRN: [id number] Basilar skull fracture remains unchanged. Patient's behavior remains unusual again secondary to bifrontal contusion Followup CT scan shows no significant changes no significant change in mass effect. Patient will require placement and/or rehabilitation. Today had the opportunity to discuss situation with the patient's mother and other family members they were encouraged.  I will sign off on this patient at the current time please contact us over this weekend if some other interaction is necessary

## 2014-08-13 LAB — BASIC METABOLIC PANEL
ANION GAP: 15 (ref 5–15)
BUN: 20 mg/dL (ref 6–23)
CALCIUM: 9.7 mg/dL (ref 8.4–10.5)
CO2: 23 meq/L (ref 19–32)
Chloride: 92 mEq/L — ABNORMAL LOW (ref 96–112)
Creatinine, Ser: 1.1 mg/dL (ref 0.50–1.35)
GFR calc Af Amer: >90 mL/min (ref >90)
GFR, EST NON AFRICAN AMERICAN: 84 mL/min — AB (ref >90)
Glucose, Bld: 316 mg/dL — ABNORMAL HIGH (ref 70–99)
Potassium: 4.6 mEq/L (ref 3.7–5.3)
Sodium: 130 mEq/L — ABNORMAL LOW (ref 137–147)

## 2014-08-13 LAB — GLUCOSE, CAPILLARY
GLUCOSE-CAPILLARY: 93 mg/dL (ref 70–99)
GLUCOSE-CAPILLARY: 222 mg/dL — AB (ref 70–99)
Glucose-Capillary: 405 mg/dL — ABNORMAL HIGH (ref 70–99)
Glucose-Capillary: 489 mg/dL — ABNORMAL HIGH (ref 70–99)
Glucose-Capillary: 445 mg/dL — ABNORMAL HIGH (ref 70–99)
Glucose-Capillary: 320 mg/dL — ABNORMAL HIGH (ref 70–99)

## 2014-08-13 MED ORDER — INSULIN ASPART 100 UNIT/ML ~~LOC~~ SOLN
0.0000 [IU] | SUBCUTANEOUS | Status: DC
  Administered 2014-08-13: 11 [IU] via SUBCUTANEOUS
  Administered 2014-08-14: 15 [IU] via SUBCUTANEOUS
  Administered 2014-08-14: 8 [IU] via SUBCUTANEOUS
  Administered 2014-08-14: 2 [IU] via SUBCUTANEOUS
  Administered 2014-08-14 – 2014-08-15 (×2): 3 [IU] via SUBCUTANEOUS
  Administered 2014-08-15: 8 [IU] via SUBCUTANEOUS

## 2014-08-13 NOTE — Progress Notes (Signed)
Patient's blood sugar 489. MD paged, call returned. Orders given. Will recheck CBG in an hour. Will continue to monitor. Karmah Potocki, [place] ScrapeSarah E
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
## 2014-08-13 NOTE — Progress Notes (Signed)
Patient agitated, noncompliant, and irritable. CBG rechecked and still 445. Physician paged, told to check q4hours and cover. Will continue to monitor. Dhairya Corales, [place] ScrapeSarah E

## 2014-08-13 NOTE — Progress Notes (Signed)
Patient insisting on ambulating with family member in room without staff assist. Attempted to educate patient and family to safety needs of patient. Family continued to assist patient with ambulation until patient started complaining of pain. Patient returned to bed, bed alarm on.

## 2014-08-13 NOTE — Progress Notes (Signed)
Patient refusing bed alarms and fall precautions. Patient educated but upset with these mechanisms. Sitter in the room. Will continue to monitor. Stefanny Pieri, [place] ScrapeSarah E

## 2014-08-14 LAB — GLUCOSE, CAPILLARY
Glucose-Capillary: 97 mg/dL (ref 70–99)
Glucose-Capillary: 147 mg/dL — ABNORMAL HIGH (ref 70–99)
Glucose-Capillary: 154 mg/dL — ABNORMAL HIGH (ref 70–99)
Glucose-Capillary: 266 mg/dL — ABNORMAL HIGH (ref 70–99)
Glucose-Capillary: 399 mg/dL — ABNORMAL HIGH (ref 70–99)

## 2014-08-14 MED ORDER — PNEUMOCOCCAL VAC POLYVALENT 25 MCG/0.5ML IJ INJ
0.5000 mL | INJECTION | INTRAMUSCULAR | Status: AC
  Administered 2014-08-15: 0.5 mL via INTRAMUSCULAR
  Filled 2014-08-14: qty 0.5

## 2014-08-14 MED ORDER — HYDROCODONE-ACETAMINOPHEN 5-325 MG PO TABS
1.0000 | ORAL_TABLET | ORAL | Status: DC | PRN
  Administered 2014-08-14: 2 via ORAL
  Filled 2014-08-14: qty 2
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
## 2014-08-14 NOTE — Progress Notes (Signed)
Agree with above 

## 2014-08-15 ENCOUNTER — Inpatient Hospital Stay (HOSPITAL_COMMUNITY)
Admission: RE | Admit: 2014-08-15 | Discharge: 2014-08-25 | DRG: 950 | Disposition: A | Discharge status: Home / Self Care | Payer: Non-veteran care | Source: Intra-hospital | Attending: Physical Medicine & Rehabilitation | Admitting: Physical Medicine & Rehabilitation

## 2014-08-15 DIAGNOSIS — S069X4S Unspecified intracranial injury with loss of consciousness of 6 hours to 24 hours, sequela: Secondary | ICD-10-CM

## 2014-08-15 DIAGNOSIS — Z794 Long term (current) use of insulin: Secondary | ICD-10-CM

## 2014-08-15 DIAGNOSIS — S069X1A Unspecified intracranial injury with loss of consciousness of 30 minutes or less, initial encounter: Secondary | ICD-10-CM

## 2014-08-15 DIAGNOSIS — S069X4A Unspecified intracranial injury with loss of consciousness of 6 hours to 24 hours, initial encounter: Secondary | ICD-10-CM

## 2014-08-15 DIAGNOSIS — W19XXXA Unspecified fall, initial encounter: Secondary | ICD-10-CM

## 2014-08-15 DIAGNOSIS — S0210XS Unspecified fracture of base of skull, sequela: Secondary | ICD-10-CM

## 2014-08-15 DIAGNOSIS — S069X9A Unspecified intracranial injury with loss of consciousness of unspecified duration, initial encounter: Secondary | ICD-10-CM | POA: Diagnosis present

## 2014-08-15 DIAGNOSIS — R488 Other symbolic dysfunctions: Secondary | ICD-10-CM | POA: Diagnosis present

## 2014-08-15 DIAGNOSIS — S065X0D Traumatic subdural hemorrhage without loss of consciousness, subsequent encounter: Principal | ICD-10-CM

## 2014-08-15 DIAGNOSIS — S069XAA Unspecified intracranial injury with loss of consciousness status unknown, initial encounter: Secondary | ICD-10-CM | POA: Diagnosis present

## 2014-08-15 DIAGNOSIS — E119 Type 2 diabetes mellitus without complications: Secondary | ICD-10-CM

## 2014-08-15 DIAGNOSIS — R260 Ataxic gait: Secondary | ICD-10-CM | POA: Diagnosis present

## 2014-08-15 DIAGNOSIS — R451 Restlessness and agitation: Secondary | ICD-10-CM | POA: Diagnosis present

## 2014-08-15 DIAGNOSIS — R4189 Other symptoms and signs involving cognitive functions and awareness: Secondary | ICD-10-CM | POA: Diagnosis present

## 2014-08-15 DIAGNOSIS — E1142 Type 2 diabetes mellitus with diabetic polyneuropathy: Secondary | ICD-10-CM | POA: Diagnosis present

## 2014-08-15 LAB — GLUCOSE, CAPILLARY
GLUCOSE-CAPILLARY: 253 mg/dL — AB (ref 70–99)
GLUCOSE-CAPILLARY: 458 mg/dL — AB (ref 70–99)
GLUCOSE-CAPILLARY: 50 mg/dL — AB (ref 70–99)
GLUCOSE-CAPILLARY: 81 mg/dL (ref 70–99)
Glucose-Capillary: 194 mg/dL — ABNORMAL HIGH (ref 70–99)
Glucose-Capillary: 341 mg/dL — ABNORMAL HIGH (ref 70–99)
Glucose-Capillary: 417 mg/dL — ABNORMAL HIGH (ref 70–99)

## 2014-08-15 LAB — COMPREHENSIVE METABOLIC PANEL
ALK PHOS: 76 U/L (ref 39–117)
ALT: 16 U/L (ref 0–53)
AST: 23 U/L (ref 0–37)
Albumin: 3.8 g/dL (ref 3.5–5.2)
Anion gap: 12 (ref 5–15)
BILIRUBIN TOTAL: 1.5 mg/dL — AB (ref 0.3–1.2)
BUN: 17 mg/dL (ref 6–23)
CHLORIDE: 96 meq/L (ref 96–112)
CO2: 27 mEq/L (ref 19–32)
Calcium: 9.5 mg/dL (ref 8.4–10.5)
Creatinine, Ser: 1.03 mg/dL (ref 0.50–1.35)
GFR calc non Af Amer: >90 mL/min (ref >90)
GLUCOSE: 167 mg/dL — AB (ref 70–99)
POTASSIUM: 4.3 meq/L (ref 3.7–5.3)
SODIUM: 135 meq/L — AB (ref 137–147)
Total Protein: 8 g/dL (ref 6.0–8.3)

## 2014-08-15 MED ORDER — HYDROCODONE-ACETAMINOPHEN 10-325 MG PO TABS
0.5000 | ORAL_TABLET | ORAL | Status: DC | PRN
  Administered 2014-08-15 – 2014-08-20 (×16): 2 via ORAL
  Filled 2014-08-15 (×17): qty 2

## 2014-08-15 MED ORDER — POLYETHYLENE GLYCOL 3350 17 G PO PACK
17.0000 g | PACK | Freq: Every day | ORAL | Status: DC
  Administered 2014-08-16 – 2014-08-21 (×6): 17 g via ORAL
  Filled 2014-08-15 (×11): qty 1

## 2014-08-15 MED ORDER — LORAZEPAM 2 MG/ML IJ SOLN: 0.5000 mg | INTRAMUSCULAR | Status: DC | PRN

## 2014-08-15 MED ORDER — LORAZEPAM 0.5 MG PO TABS
0.5000 mg | ORAL_TABLET | ORAL | Status: DC | PRN
  Administered 2014-08-15 – 2014-08-16 (×2): 0.5 mg via ORAL
  Filled 2014-08-15 (×2): qty 1

## 2014-08-15 MED ORDER — SORBITOL 70 % SOLN
30.0000 mL | Freq: Every day | Status: DC | PRN
Start: 2014-08-15 — End: 2014-08-25
  Administered 2014-08-16 – 2014-08-17 (×2): 30 mL via ORAL
  Filled 2014-08-15 (×3): qty 30

## 2014-08-15 MED ORDER — ACETAMINOPHEN 325 MG PO TABS: 325.0000 mg | ORAL_TABLET | ORAL | Status: DC | PRN

## 2014-08-15 MED ORDER — INSULIN ASPART 100 UNIT/ML ~~LOC~~ SOLN
0.0000 [IU] | Freq: Three times a day (TID) | SUBCUTANEOUS | Status: DC
  Administered 2014-08-16: 7 [IU] via SUBCUTANEOUS
  Administered 2014-08-16: 5 [IU] via SUBCUTANEOUS
  Administered 2014-08-16: 3 [IU] via SUBCUTANEOUS
  Administered 2014-08-17 – 2014-08-18 (×3): 9 [IU] via SUBCUTANEOUS
  Administered 2014-08-18: 3 [IU] via SUBCUTANEOUS
  Administered 2014-08-19: 1 [IU] via SUBCUTANEOUS
  Administered 2014-08-19: 9 [IU] via SUBCUTANEOUS
  Administered 2014-08-20: 5 [IU] via SUBCUTANEOUS
  Administered 2014-08-20: 3 [IU] via SUBCUTANEOUS
  Administered 2014-08-20 – 2014-08-21 (×2): 5 [IU] via SUBCUTANEOUS
  Administered 2014-08-21: 1 [IU] via SUBCUTANEOUS
  Administered 2014-08-21 – 2014-08-22 (×2): 9 [IU] via SUBCUTANEOUS
  Administered 2014-08-22: 1 [IU] via SUBCUTANEOUS
  Administered 2014-08-22: 7 [IU] via SUBCUTANEOUS
  Administered 2014-08-23: 5 [IU] via SUBCUTANEOUS
  Administered 2014-08-23: 7 [IU] via SUBCUTANEOUS
  Administered 2014-08-23: 2 [IU] via SUBCUTANEOUS
  Administered 2014-08-24: 3 [IU] via SUBCUTANEOUS
  Administered 2014-08-24: 10 [IU] via SUBCUTANEOUS
  Administered 2014-08-25: 5 [IU] via SUBCUTANEOUS

## 2014-08-15 MED ORDER — AMANTADINE HCL 100 MG PO CAPS
100.0000 mg | ORAL_CAPSULE | Freq: Two times a day (BID) | ORAL | Status: DC
  Administered 2014-08-15: 100 mg via ORAL
  Filled 2014-08-15 (×2): qty 1

## 2014-08-15 MED ORDER — VITAMIN B-1 100 MG PO TABS
100.0000 mg | ORAL_TABLET | Freq: Every day | ORAL | Status: DC
  Administered 2014-08-16 – 2014-08-25 (×10): 100 mg via ORAL
  Filled 2014-08-15 (×11): qty 1

## 2014-08-15 MED ORDER — HYDROCODONE-ACETAMINOPHEN 10-325 MG PO TABS
0.5000 | ORAL_TABLET | ORAL | Status: DC | PRN
  Filled 2014-08-15: qty 2

## 2014-08-15 MED ORDER — ONDANSETRON HCL 4 MG PO TABS: 4.0000 mg | ORAL_TABLET | Freq: Four times a day (QID) | ORAL | Status: DC | PRN

## 2014-08-15 MED ORDER — FOLIC ACID 1 MG PO TABS
1.0000 mg | ORAL_TABLET | Freq: Every day | ORAL | Status: DC
  Administered 2014-08-16 – 2014-08-25 (×10): 1 mg via ORAL
  Filled 2014-08-15 (×11): qty 1

## 2014-08-15 MED ORDER — INSULIN DETEMIR 100 UNIT/ML ~~LOC~~ SOLN
18.0000 [IU] | Freq: Every day | SUBCUTANEOUS | Status: DC
  Administered 2014-08-15: 18 [IU] via SUBCUTANEOUS
  Filled 2014-08-15 (×2): qty 0.18

## 2014-08-15 MED ORDER — PREGABALIN 50 MG PO CAPS
75.0000 mg | ORAL_CAPSULE | Freq: Two times a day (BID) | ORAL | Status: DC
Start: 2014-08-15 — End: 2014-08-18
  Administered 2014-08-16 – 2014-08-18 (×5): 75 mg via ORAL
  Filled 2014-08-15 (×10): qty 1

## 2014-08-15 MED ORDER — CIPROFLOXACIN-HYDROCORTISONE 0.2-1 % OT SUSP
3.0000 [drp] | Freq: Two times a day (BID) | OTIC | Status: DC
  Administered 2014-08-17: 3 [drp] via OTIC
  Filled 2014-08-15: qty 10

## 2014-08-15 MED ORDER — INSULIN ASPART 100 UNIT/ML ~~LOC~~ SOLN
0.0000 [IU] | Freq: Three times a day (TID) | SUBCUTANEOUS | Status: DC
  Administered 2014-08-15: 9 [IU] via SUBCUTANEOUS
  Administered 2014-08-15: 7 [IU] via SUBCUTANEOUS

## 2014-08-15 MED ORDER — DOCUSATE SODIUM 100 MG PO CAPS
100.0000 mg | ORAL_CAPSULE | Freq: Two times a day (BID) | ORAL | Status: DC
  Administered 2014-08-15 – 2014-08-25 (×17): 100 mg via ORAL
  Filled 2014-08-15 (×22): qty 1

## 2014-08-15 MED ORDER — ONDANSETRON HCL 4 MG/2ML IJ SOLN: 4.0000 mg | Freq: Four times a day (QID) | INTRAMUSCULAR | Status: DC | PRN

## 2014-08-15 NOTE — Progress Notes (Signed)
Report given, patient ready to be transferred with a Sitter.

## 2014-08-15 NOTE — Progress Notes (Signed)
Patient ready to be d/c to rehab today, Called for report but no nurse has been assigned to patient yet. Awaiting to be transferred over there.
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
## 2014-08-15 NOTE — Progress Notes (Signed)
Rehab admissions - Noted patient with sitter at bedside.  I called and spoke with his mother.  Mom does want patient to [facility].  Mom plans to take patient home with her.  Mom understands the need for supervision after rehab stay.  Bed available on rehab and will admit to acute inpatient rehab today.  Call me for questions.  #[phone]

## 2014-08-15 NOTE — Progress Notes (Signed)
Plan CIR today Patient examined and I agree with the assessment and plan  Violeta GelinasBurke Luella Gardenhire, MD, MPH, FACS Trauma: [phone] General Surgery: [phone]  08/15/2014 12:03 PM
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
## 2014-08-15 NOTE — Progress Notes (Signed)
CBG recheck is 81. Will continue to monitor.

## 2014-08-15 NOTE — Clinical Social Work Note (Addendum)
CSW was notified that CIR will be admitted pt today to inpatient rehab.  CSW signing off.  Vickii PennaGina Jadarion Halbig, LCSWA [phone]  Psychiatric & Orthopedics (5N 1-16) Clinical Social Worker
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
## 2014-08-15 NOTE — Progress Notes (Signed)
CBG is 50. Asymptomatic. 4 FL OZ of chocolate ice cream given as requested by patient. Will follow up in 15 min.
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
## 2014-08-15 NOTE — Discharge Summary (Signed)
Shelina Luo, MD, MPH, FACS Trauma: [phone] General Surgery: [phone]  

## 2014-08-15 NOTE — Progress Notes (Signed)
Pt arrived around 1800
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
## 2014-08-16 ENCOUNTER — Inpatient Hospital Stay (HOSPITAL_COMMUNITY): Payer: Non-veteran care | Admitting: *Deleted

## 2014-08-16 ENCOUNTER — Inpatient Hospital Stay (HOSPITAL_COMMUNITY): Payer: Non-veteran care

## 2014-08-16 ENCOUNTER — Inpatient Hospital Stay (HOSPITAL_COMMUNITY): Payer: Non-veteran care | Admitting: Speech Pathology

## 2014-08-16 LAB — GLUCOSE, CAPILLARY
GLUCOSE-CAPILLARY: 273 mg/dL — AB (ref 70–99)
Glucose-Capillary: 313 mg/dL — ABNORMAL HIGH (ref 70–99)
Glucose-Capillary: 268 mg/dL — ABNORMAL HIGH (ref 70–99)
Glucose-Capillary: 294 mg/dL — ABNORMAL HIGH (ref 70–99)

## 2014-08-16 LAB — CBC WITH DIFFERENTIAL/PLATELET
Basophils Absolute: 0.1 10*3/uL (ref 0.0–0.1)
Basophils Relative: 1 % (ref 0–1)
EOS PCT: 3 % (ref 0–5)
Eosinophils Absolute: 0.3 10*3/uL (ref 0.0–0.7)
HEMATOCRIT: 37.5 % — AB (ref 39.0–52.0)
Hemoglobin: 12.4 g/dL — ABNORMAL LOW (ref 13.0–17.0)
LYMPHS ABS: 3.2 10*3/uL (ref 0.7–4.0)
Lymphocytes Relative: 30 % (ref 12–46)
MCH: 24.3 pg — ABNORMAL LOW (ref 26.0–34.0)
MCHC: 33.1 g/dL (ref 30.0–36.0)
MCV: 73.5 fL — AB (ref 78.0–100.0)
MONOS PCT: 9 % (ref 3–12)
Monocytes Absolute: 1 10*3/uL (ref 0.1–1.0)
NEUTROS ABS: 6 10*3/uL (ref 1.7–7.7)
Neutrophils Relative %: 57 % (ref 43–77)
Platelets: 323 10*3/uL (ref 150–400)
RBC: 5.1 MIL/uL (ref 4.22–5.81)
RDW: 12.9 % (ref 11.5–15.5)
WBC: 10.6 10*3/uL — ABNORMAL HIGH (ref 4.0–10.5)

## 2014-08-16 LAB — COMPREHENSIVE METABOLIC PANEL
ALT: 16 U/L (ref 0–53)
AST: 15 U/L (ref 0–37)
Albumin: 3.8 g/dL (ref 3.5–5.2)
Alkaline Phosphatase: 95 U/L (ref 39–117)
Anion gap: 11 (ref 5–15)
BILIRUBIN TOTAL: 1.1 mg/dL (ref 0.3–1.2)
BUN: 19 mg/dL (ref 6–23)
CO2: 28 meq/L (ref 19–32)
Calcium: 9.6 mg/dL (ref 8.4–10.5)
Chloride: 95 mEq/L — ABNORMAL LOW (ref 96–112)
Creatinine, Ser: 1.07 mg/dL (ref 0.50–1.35)
GFR calc Af Amer: >90 mL/min (ref >90)
GFR, EST NON AFRICAN AMERICAN: 87 mL/min — AB (ref >90)
Glucose, Bld: 337 mg/dL — ABNORMAL HIGH (ref 70–99)
Potassium: 4.8 mEq/L (ref 3.7–5.3)
Sodium: 134 mEq/L — ABNORMAL LOW (ref 137–147)
Total Protein: 7.9 g/dL (ref 6.0–8.3)

## 2014-08-16 MED ORDER — LORAZEPAM 2 MG/ML IJ SOLN: 2.0000 mg | INTRAMUSCULAR | Status: AC

## 2014-08-16 MED ORDER — HALOPERIDOL LACTATE 5 MG/ML IJ SOLN
2.0000 mg | INTRAMUSCULAR | Status: AC
  Administered 2014-08-16: 2 mg via INTRAMUSCULAR
  Filled 2014-08-16: qty 1

## 2014-08-16 MED ORDER — CARBAMAZEPINE 200 MG PO TABS
200.0000 mg | ORAL_TABLET | Freq: Three times a day (TID) | ORAL | Status: DC
  Administered 2014-08-16 – 2014-08-21 (×16): 200 mg via ORAL
  Filled 2014-08-16 (×22): qty 1

## 2014-08-16 MED ORDER — LORAZEPAM 1 MG PO TABS
1.0000 mg | ORAL_TABLET | ORAL | Status: DC | PRN
  Administered 2014-08-16 – 2014-08-17 (×2): 1 mg via ORAL
  Filled 2014-08-16 (×3): qty 1

## 2014-08-16 MED ORDER — QUETIAPINE FUMARATE 50 MG PO TABS
50.0000 mg | ORAL_TABLET | Freq: Two times a day (BID) | ORAL | Status: DC
  Administered 2014-08-16 – 2014-08-17 (×2): 50 mg via ORAL
  Filled 2014-08-16 (×4): qty 1

## 2014-08-16 MED ORDER — INSULIN GLARGINE 100 UNIT/ML ~~LOC~~ SOLN
20.0000 [IU] | Freq: Every day | SUBCUTANEOUS | Status: DC
  Administered 2014-08-16 – 2014-08-18 (×3): 20 [IU] via SUBCUTANEOUS
  Filled 2014-08-16 (×5): qty 0.2

## 2014-08-16 MED ORDER — QUETIAPINE FUMARATE 50 MG PO TABS
50.0000 mg | ORAL_TABLET | Freq: Two times a day (BID) | ORAL | Status: DC | PRN
  Filled 2014-08-16: qty 1

## 2014-08-16 MED ORDER — LORAZEPAM 2 MG/ML IJ SOLN
INTRAMUSCULAR | Status: AC
  Administered 2014-08-16: 2 mg
  Filled 2014-08-16: qty 1

## 2014-08-16 MED ORDER — METHOCARBAMOL 500 MG PO TABS
500.0000 mg | ORAL_TABLET | Freq: Four times a day (QID) | ORAL | Status: DC | PRN
  Administered 2014-08-16 – 2014-08-20 (×4): 500 mg via ORAL
  Filled 2014-08-16 (×4): qty 1

## 2014-08-16 MED ORDER — LORAZEPAM 2 MG/ML IJ SOLN: 1.0000 mg | INTRAMUSCULAR | Status: DC | PRN

## 2014-08-16 MED ORDER — HALOPERIDOL 2 MG PO TABS
2.0000 mg | ORAL_TABLET | ORAL | Status: AC
  Filled 2014-08-16: qty 1

## 2014-08-16 NOTE — Progress Notes (Signed)
Physical Therapy Session Note  Patient Details  Name: Derek AmbleLarry [name] MRN: [id number] Date of Birth: March 09, 1976  Today's Date: 08/16/2014 PT Individual Time: 1630-1700 PT Individual Time Calculation (min): 30 min   Short Term Goals: Week 1:  PT Short Term Goal 1 (Week 1): STGs=LTGs  Skilled Therapeutic Interventions/Progress Updates:  Patient received sitting at computer in family room with sitter present. Patient initially agitated secondary to MD telling patient he is not allowed outside. First few minutes, patient cursing loudly and upset about not going outside. Patient able to be redirected by suggestions to improve back pain. Patient given ice to put on low back and therapist fixed cup of hot chocolate per patient request; also, played music that patient enjoys. Session focused on therapeutic conversation about patient's history, including traveling, time in [employer], etc. Patient tolerates presence of therapist and others in day room with minimal agitation once redirected from initial verbal agitation about not being allowed outside. Patient left sitting in family room with sitter present.  Therapy Documentation Precautions:  Precautions Precautions: Fall Precaution Comments: bouts of agitation Restrictions Weight Bearing Restrictions: No Pain: Pain Assessment Pain Assessment: No/denies pain Pain Score: 0-No pain Pain Type: Acute pain Pain Location: Back Pain Orientation: Lower Pain Descriptors / Indicators: Aching Pain Frequency: Constant Pain Onset: On-going Patients Stated Pain Goal: 4 Pain Intervention(s): Medication (See eMAR)  See FIM for current functional status  Therapy/Group: Individual Therapy  Chipper HerbBridget S Rhyleigh Grassel S. Coral [name], PT, DPT  08/16/2014, 5:28 PM
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
## 2014-08-16 NOTE — Progress Notes (Signed)
Patient complains of back pain, especially when moving.  Obtained new order for muscle relaxer, offered to patient, he declined saying that he will wait for now.  Dani Gobbleeardon, Chanda Laperle J, RN

## 2014-08-16 NOTE — Progress Notes (Signed)
Inpatient Diabetes Program Recommendations  AACE/ADA: New Consensus Statement on Inpatient Glycemic Control (2013)  Target Ranges:  Prepandial:   less than 140 mg/dL      Peak postprandial:   less than 180 mg/dL (1-2 hours)      Critically ill patients:  140 - 180 mg/dL    Inpatient Diabetes Program Recommendations Insulin - Basal: Please increase lantus to 30 units and then assess when the high cbg's are occuring. Once fasting is controlled the lantus will be at the correct dose Correction (SSI): xxxxxxxxxxxxxxxxx Insulin - Meal Coverage: xxxxxxxxxxxxxxxx Diet: xxxxxxxxxxxxxxxxxxxxxxxx  Thank you, Lenor CoffinAnn Raeleen Winstanley, RN, CNS, Diabetes Coordinator [phone])

## 2014-08-16 NOTE — Plan of Care (Signed)
Problem: RH SAFETY Goal: RH STG ADHERE TO SAFETY PRECAUTIONS W/ASSISTANCE/DEVICE STG Adhere to Safety Precautions With Assistance/Device.supervision  Outcome: Not Progressing Attempts to get out of bed unassisted.  Gets verbally and physically aggressive at times.  Problem: RH PAIN MANAGEMENT Goal: RH STG PAIN MANAGED AT OR BELOW PT'S PAIN GOAL Pain goal less than 2  Outcome: Not Progressing Consistently has pain in back and head which po pain meds arent really effective.

## 2014-08-16 NOTE — Evaluation (Signed)
The skilled treatment note has been reviewed and SLP is in agreement.  Mayleen Borrero, M.A., CCC-SLP  [phone]   
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
## 2014-08-16 NOTE — Progress Notes (Signed)
Patient was ordered to have haldol 2mg  IM now, gave 3mg  instead, Riley LamEunice the oncall PA aware. Ordered to monitor patient reaction.  Dani Gobbleeardon, Kooper Godshall J, RN
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
## 2014-08-16 NOTE — Progress Notes (Signed)
Patient information reviewed and entered into eRehab system by Cylinda Santoli, RN, CRRN, PPS Coordinator.  Information including medical coding and functional independence measure will be reviewed and updated through discharge.    

## 2014-08-16 NOTE — Progress Notes (Signed)
Patient getting increasingly agitated.  Per NT report, patients family was visiting and asking questions to the patient like "why are you acting like this" etc, which got patient angry.  Clarisse GougeBridget, PT asked family to step out of room.  Patient is yelling that he is going to leave the hospital.  At one time, he opened the window and verbalized his desire to escape out of the window.  Patient is difficult to console, but eventually calmed down.  Patient taken to dayroom for lunch, called facilities to come lock windows.  Patient is now resting in room, with sitter at bedside.  Dani Gobbleeardon, Hulon Ferron J, RN

## 2014-08-16 NOTE — Progress Notes (Signed)
Called to room by Dorathy DaftKayla, OT stating patient was having seizure like symptoms, that he was acting very weird after his shower.  Patient was lying in bed, unclothed, head looking off to left side.  Patient initially would not respond verbally.  Sat at bedside, asked patient to squeeze hand, he followed command and when I asked him what happened, he spelled it out "P, A, I, N" and when asked where he spelled out "E, V, E, R, Y, W, H, E, R, E."  I told him I could give him some vicodin, and he seemed very suspicious saying "I am trusting you, because yall are giving me a lot of pussy ass shit and it aint helping me."  Patient took the pain meds as requested and I gave him a chocolate glucerna at his request as well.  Patient is now lying in bed quietly, sitter at bedside.  Will continue to monitor. Dani Gobbleeardon, Rolfe Hartsell J, RN
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
## 2014-08-17 ENCOUNTER — Inpatient Hospital Stay (HOSPITAL_COMMUNITY): Payer: Non-veteran care

## 2014-08-17 ENCOUNTER — Inpatient Hospital Stay (HOSPITAL_COMMUNITY): Payer: Non-veteran care | Admitting: Speech Pathology

## 2014-08-17 LAB — URINALYSIS, ROUTINE W REFLEX MICROSCOPIC
Bilirubin Urine: NEGATIVE
KETONES UR: 15 mg/dL — AB
Nitrite: NEGATIVE
PROTEIN: NEGATIVE mg/dL
Specific Gravity, Urine: 1.035 — ABNORMAL HIGH (ref 1.005–1.030)
UROBILINOGEN UA: 1 mg/dL (ref 0.0–1.0)
pH: 5 (ref 5.0–8.0)

## 2014-08-17 LAB — GLUCOSE, CAPILLARY
GLUCOSE-CAPILLARY: 388 mg/dL — AB (ref 70–99)
Glucose-Capillary: 469 mg/dL — ABNORMAL HIGH (ref 70–99)
Glucose-Capillary: 570 mg/dL (ref 70–99)
Glucose-Capillary: 334 mg/dL — ABNORMAL HIGH (ref 70–99)

## 2014-08-17 LAB — URINE MICROSCOPIC-ADD ON

## 2014-08-17 MED ORDER — NICOTINE 21 MG/24HR TD PT24
21.0000 mg | MEDICATED_PATCH | Freq: Every day | TRANSDERMAL | Status: DC
  Filled 2014-08-17 (×10): qty 1

## 2014-08-17 MED ORDER — HALOPERIDOL 1 MG PO TABS
3.0000 mg | ORAL_TABLET | Freq: Three times a day (TID) | ORAL | Status: DC | PRN
Start: 2014-08-17 — End: 2014-08-25
  Administered 2014-08-17 – 2014-08-23 (×3): 3 mg via ORAL
  Filled 2014-08-17 (×5): qty 1

## 2014-08-17 MED ORDER — HALOPERIDOL LACTATE 5 MG/ML IJ SOLN
3.0000 mg | Freq: Three times a day (TID) | INTRAMUSCULAR | Status: DC | PRN
  Administered 2014-08-21: 3 mg via INTRAMUSCULAR
  Filled 2014-08-17 (×3): qty 1

## 2014-08-17 MED ORDER — QUETIAPINE FUMARATE 50 MG PO TABS
50.0000 mg | ORAL_TABLET | Freq: Three times a day (TID) | ORAL | Status: DC
  Administered 2014-08-17 – 2014-08-18 (×3): 50 mg via ORAL
  Filled 2014-08-17 (×6): qty 1

## 2014-08-17 MED ORDER — INSULIN ASPART 100 UNIT/ML ~~LOC~~ SOLN
5.0000 [IU] | Freq: Once | SUBCUTANEOUS | Status: AC
  Administered 2014-08-17: 5 [IU] via SUBCUTANEOUS

## 2014-08-17 NOTE — Significant Event (Signed)
CRITICAL VALUE ALERT  Critical value received:  570  Date of notification:  t  Time of notification:  1630  Critical value read back:Yes.    Nurse who received alert:  Chana Bodeeborah Venus Ruhe  MD notified (1st page):  Dan FinlandAnguilla PAC  Time of first page:  1630  MD notified (2nd page):  Time of second page:  Responding MD:  Dan FinlandAN[place], [facility]  Time MD responded:  1630; order to give 9 units of insulin

## 2014-08-17 NOTE — Progress Notes (Signed)
Patient awake at 2305 with discomfort and urge to void. Patient stated he was unable to void with mild agitation. Bladder scan 49 with abdomen distended and irritation. I/O cath bladder at 2345 for 700 ml yellow urine, clear with no odor. Scheduled HS medications given to patient crushed with orange sherbet ice cream per patients request. Patient with complaints of back pain and discomfort. Applied Kpad to low back area. Patient resting quietly in bed with no distress after eating his HS snack. adm

## 2014-08-17 NOTE — Progress Notes (Signed)
Speech Language Pathology Daily Session Note  Patient Details  Name: [name] MRN: [id number] Date of Birth: 12/21/1975  Today's Date: 08/17/2014 SLP Individual Time: 1100-1205 SLP Individual Time Calculation (min): 65 min  Short Term Goals: Week 1: SLP Short Term Goal 1 (Week 1): Patient will request assistance with wants/needs verbally or by using call bell with Mod A.  SLP Short Term Goal 2 (Week 1): Patient will sustain attention to basic functional tasks for 10 minutes with Mod A. SLP Short Term Goal 3 (Week 1): Patient will demonstrate functional problem solving for basic and familiar tasks with Mod A. SLP Short Term Goal 4 (Week 1): Patient will utilize external visual aid to recall new, daily information with Mod A. SLP Short Term Goal 5 (Week 1): Patient will utilize word finding compensatory strategies at the phrase level with Mod A.  Skilled Therapeutic Interventions: Skilled treatment session focused on cognitive-linguistic goals. Upon arrival, patient grimacing and crying out due to lower back pain. Patient refused pain medication but NT was massaging area and applying heat which appeared to calm the patient. SLP facilitated session by providing encouragement for participation in session. Patient ambulated to dayroom with +2 assist due to pain and consumed lunch meal of regular textures with large bites. Patient consumed meal utilizing large bites/sips but did not demonstrate any overt s/s of aspiration.  Patient demonstrated selective attention to meal for ~30 minutes in a mildly distracting environment with supervision verbal cues and required Mod-Max A multimodal cues for problem solving with tray set-up. Patient independently requested to use the bathroom and required extra time and Max A multimodal cues for problem solving throughout the task. Patient demonstrated intermittent verbal agitation but was easily redirected. Patient left in room with sitter present.    FIM:   Comprehension Comprehension Mode: Auditory Comprehension: 4-Understands basic 75 - 89% of the time/requires cueing 10 - 24% of the time Expression Expression Mode: Verbal Expression: 3-Expresses basic 50 - 74% of the time/requires cueing 25 - 50% of the time. Needs to repeat parts of sentences. Social Interaction Social Interaction: 2-Interacts appropriately 25 - 49% of time - Needs frequent redirection. Problem Solving Problem Solving: 2-Solves basic 25 - 49% of the time - needs direction more than half the time to initiate, plan or complete simple activities Memory Memory: 2-Recognizes or recalls 25 - 49% of the time/requires cueing 51 - 75% of the time FIM - Eating Eating Activity: 5: Supervision/cues  Pain Pain Assessment Pain Assessment: 0-10 Pain Score: 10-Worst pain ever Pain Location: Back Pain Orientation: Lower;Mid Pain Descriptors / Indicators: Aching;Sore Pain Onset: On-going Patients Stated Pain Goal: 7 Pain Intervention(s): Medication (See eMAR)  Therapy/Group: Individual Therapy  Aldyn Toon 08/17/2014, 3:25 PM

## 2014-08-17 NOTE — Progress Notes (Signed)
08/17/14 1505  What Happened  Was fall witnessed? No  Patients activity before fall bathroom-unassisted (pt refused sitter at side when on toilet)  Point of contact arm/shoulder  Was patient injured? Unsure  Patient found in bathroom  Found by Staff-comment (sitter called another nurse to assist)  Stated prior activity bathroom-unassisted  Adult Fall Risk Assessment  Risk Factor Category (scoring not indicated) High fall risk per protocol (document High fall risk)  Age 38  Fall History: Fall within 6 months prior to admission 5  Elimination; Bowel and/or Urine Incontinence 0  Elimination; Bowel and/or Urine Urgency/Frequency 2  Medications: includes PCA/Opiates, Anti-convulsants, Anti-hypertensives, Diuretics, Hypnotics, Laxatives, Sedatives, and Psychotropics 5  Patient Care Equipment 0  Mobility-Assistance 2  Mobility-Gait 2  Mobility-Sensory Deficit 2  Cognition-Awareness 1  Cognition-Impulsiveness 2  Cognition-Limitations 4  Total Score 25  Patient's Fall Risk High Fall Risk (>13 points)  Adult Fall Risk Interventions  Required Bundle Interventions *See Row Information* High fall risk - low, moderate, and high requirements implemented  Additional Interventions 24 hour supervision/sitter  orders received for xray rt humerus and lumbar spine due to report of pain on rt shoulder p fall and landed on rt shoulder when slid off of toilet. Pamelia HoitSharp, Carmellia Kreisler B

## 2014-08-17 NOTE — Progress Notes (Signed)
Occupational Therapy Session Note  Patient Details  Name: Derek AmbleLarry [name] MRN: [id number] Date of Birth: 03/05/1976  Today's Date: 08/17/2014 OT Individual Time: [id number] and 1330-1415 OT Individual Time Calculation (min): 78 min and 45 min     Short Term Goals: Week 1:  OT Short Term Goal 1 (Week 1): Pt will sustain attention to self-care task for 30 seconds with max cues OT Short Term Goal 2 (Week 1): Pt will complete bathing task at supervision level with max cues  OT Short Term Goal 3 (Week 1): Pt will be oriented x3 with max cues  OT Short Term Goal 4 (Week 1): Pt will initiate one self-care task with min cues  Skilled Therapeutic Interventions/Progress Updates:    Session 1: Pt seen for 1:1 OT session with focus on dynamic standing balance, functional mobility, cognitive remediation, and active participation. Pt received supine in bed finishing breakfast. Pt perseverating on restraints and requesting to be "untied." Pt with agitation noted as he began cursing and raising voice a therapist. Able to redirect by encouraging to transfer to recliner for comfort. Pt began perseverating on blood sugars with increased agitation noted as pt again cursing at therapist and being inappropriate. Pt redirected after having blood sugar checked and initiated bathing. Pt completed bathing at supervision level, standing for 75% of task. Pt required min assist for ambulation from shower>recliner chair and max assist for controlled stand>sit. Pt required max cues for completion of dressing, requiring max assist for LB dressing d/t increased pain with hip flexion. Pt left in recliner chair with sitter and all needs in reach.   Session 2; Pt seen for 1:1 OT session with focus on attention, active participation, and cognitive remediation. Pt received asleep in recliner chair, requiring more than reasonable amount of time for arousal. Pt with poor frustration tolerance upon awakening however easily redirected with  snack as pt reported feeling hungry. Pt declining getting out of recliner d/t pain in back and buttocks. Engaged in therapeutic conversation with emphasis on pt's interests and favorite football teams. Pt required increased time for word finding to recall names of teams. Pt with ~90% accuracy when identifying names of professional teams vs college teams. Pt also accurately recalled coach of favorite team with increased time. Pt engaged in appropriate conversation grossly 35 min. Pt politely asking therapist at times to readjust pillow in recliner for comfort and thanking therapist for assistance. Pt left sitting in recliner chair with NT present.   Therapy Documentation Precautions:  Precautions Precautions: Fall Precaution Comments: bouts of agitation Restrictions Weight Bearing Restrictions: No General:   Vital Signs:   Pain: Pain Assessment Pain Assessment: 0-10 Pain Score: 10-Worst pain ever Faces Pain Scale: Hurts whole lot Pain Location: Back Pain Orientation: Lower;Mid Pain Descriptors / Indicators: Aching Patients Stated Pain Goal: 7 Pain Intervention(s): Repositioned;Medication (See eMAR);Heat applied  See FIM for current functional status  Therapy/Group: Individual Therapy  [name], Taje Littler N 08/17/2014, 12:20 PM
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
## 2014-08-17 NOTE — Significant Event (Signed)
CRITICAL VALUE ALERT  Critical value received:  CBG 469  Date of notification:  10/7  Time of notification:  1120  Critical value read back:Yes.    Nurse who received alert:  Chana Bodeeborah Angelgabriel Willmore [phone]  MD notified (1st page):  Dan FinlandANguilla [facility]  Time of first page:  1130  MD notified (2nd page):  Time of second page:  Responding MD:  Dan FinlandAnguilla  Time MD responded:  1130  Order received to give 9 units of insuliln as ordered for <400

## 2014-08-17 NOTE — Progress Notes (Signed)
Physical Therapy Session Note  Patient Details  Name: Derek AmbleLarry Cumbee MRN: [id number] Date of Birth: 1976/08/16  Today's Date: 08/17/2014 PT Individual Time: 1000-1100 PT Individual Time Calculation (min): 60 min  Session 2 Time: Time Calculation (min)  Short Term Goals: Week 1:  PT Short Term Goal 1 (Week 1): STGs=LTGs  Skilled Therapeutic Interventions/Progress Updates:    Session 1: Pt received seated in recliner w/ sitter present. Pt initially mildly agitated towards therapist, perseverative on whole body pain, use of restraints last night. Pt strongly declined upright ambulation w/ therapist. After phone conversation w/ mother, pt became more agitated. Therapist left room for a few minutes to de-escalate situation. Upon return, pt was cycling back down from escalation and remained calmer remainder of session. Pt tolerated therapist and sitter's presence for 45 minutes w/ therapist engaging pt in therapeutic conversation regarding his interests including hunting, fishing, grilling food. Towards end of session pt agreed to in room exercises. Therapist brought 8# dumbbells to room and pt performed bicep curls w/ BUE x25 ea. Pt left seated in recliner w/ sitter present and all needs within reach.   Session 2: Pt received seated in recliner w/ sitter present, agreeable to participate in therapy. Pt noted to be less agitated than this AM, able to remember this therapist from AM session. Pt agreed to work out upper extremities w/ therapist in room. Pt able to attend to weightlifting task for 10 minutes without any agitation while asking appropriate questions about exercises. After taking medication from RN, pt suddenly stood up from recliner without warning and expressed that he needed to move his bowels. Assisted pt to bathroom w/ MinA. Pt remained on toilet without attempting to get up by himself. Pt left seated on toilet w/ RN and NT present.   Therapy Documentation Precautions:   Precautions Precautions: Fall Precaution Comments: bouts of agitation Restrictions Weight Bearing Restrictions: No General:   Vital Signs:   Pain: Pain Assessment Pain Assessment: 0-10 Pain Score: 10-Worst pain ever Faces Pain Scale: Hurts whole lot Pain Location: Back Pain Orientation: Lower;Mid Pain Descriptors / Indicators: Aching Patients Stated Pain Goal: 7 Pain Intervention(s): Repositioned;Medication (See eMAR);Heat applied  See FIM for current functional status  Therapy/Group: Individual Therapy  Hosie SpangleGodfrey, Jacquiline Zurcher Hosie SpangleJess Raseel Jans, PT, DPT 08/17/2014, 7:54 AM

## 2014-08-17 NOTE — Plan of Care (Signed)
Problem: RH BOWEL ELIMINATION Goal: RH STG MANAGE BOWEL WITH ASSISTANCE STG Manage Bowel with Assistance.min assist  Outcome: Not Progressing Patients last BM on 08/07/14. Sorbitol given during previous shift. am

## 2014-08-18 ENCOUNTER — Inpatient Hospital Stay (HOSPITAL_COMMUNITY): Payer: Non-veteran care | Admitting: *Deleted

## 2014-08-18 ENCOUNTER — Inpatient Hospital Stay (HOSPITAL_COMMUNITY): Payer: Non-veteran care

## 2014-08-18 ENCOUNTER — Inpatient Hospital Stay (HOSPITAL_COMMUNITY): Payer: Non-veteran care | Admitting: Speech Pathology

## 2014-08-18 LAB — URINE CULTURE
CULTURE: NO GROWTH
Colony Count: NO GROWTH

## 2014-08-18 LAB — GLUCOSE, CAPILLARY
GLUCOSE-CAPILLARY: 105 mg/dL — AB (ref 70–99)
Glucose-Capillary: 430 mg/dL — ABNORMAL HIGH (ref 70–99)
Glucose-Capillary: 135 mg/dL — ABNORMAL HIGH (ref 70–99)
Glucose-Capillary: 119 mg/dL — ABNORMAL HIGH (ref 70–99)
Glucose-Capillary: 422 mg/dL — ABNORMAL HIGH (ref 70–99)
Glucose-Capillary: 328 mg/dL — ABNORMAL HIGH (ref 70–99)
Glucose-Capillary: 505 mg/dL — ABNORMAL HIGH (ref 70–99)

## 2014-08-18 LAB — GLUCOSE, RANDOM: Glucose, Bld: 468 mg/dL — ABNORMAL HIGH (ref 70–99)

## 2014-08-18 MED ORDER — INSULIN ASPART 100 UNIT/ML ~~LOC~~ SOLN
5.0000 [IU] | Freq: Once | SUBCUTANEOUS | Status: AC
  Administered 2014-08-18: 23:00:00 via SUBCUTANEOUS

## 2014-08-18 MED ORDER — QUETIAPINE FUMARATE 100 MG PO TABS
100.0000 mg | ORAL_TABLET | Freq: Three times a day (TID) | ORAL | Status: DC
  Administered 2014-08-18 – 2014-08-25 (×19): 100 mg via ORAL
  Filled 2014-08-18 (×24): qty 1

## 2014-08-18 MED ORDER — CIPROFLOXACIN HCL 250 MG PO TABS
250.0000 mg | ORAL_TABLET | Freq: Two times a day (BID) | ORAL | Status: DC
  Administered 2014-08-18 – 2014-08-19 (×3): 250 mg via ORAL
  Filled 2014-08-18 (×8): qty 1

## 2014-08-18 NOTE — Progress Notes (Signed)
Nursing Note: Pt refused his colace and his ear drops but agreed to take the rest of his meds.wbb

## 2014-08-18 NOTE — Plan of Care (Signed)
Problem: RH SAFETY Goal: RH STG ADHERE TO SAFETY PRECAUTIONS W/ASSISTANCE/DEVICE STG Adhere to Safety Precautions With Assistance/Device.supervision  Outcome: Not Progressing Pt has a Recruitment consultantsafety sitter; needs to be redirected very impulsive; easily agitated Goal: RH STG DECREASED RISK OF FALL WITH ASSISTANCE STG Decreased Risk of Fall With Assistance.supervision  Outcome: Not Progressing Patient easily agitated; cusses

## 2014-08-18 NOTE — Progress Notes (Signed)
08/17/14 2220  Adult Fall Risk Assessment  Risk Factor Category (scoring not indicated) Fall has occurred during this admission (document High fall risk)  Age 38  Fall History: Fall within 6 months prior to admission 0  Elimination; Bowel and/or Urine Incontinence 0  Elimination; Bowel and/or Urine Urgency/Frequency 0  Medications: includes PCA/Opiates, Anti-convulsants, Anti-hypertensives, Diuretics, Hypnotics, Laxatives, Sedatives, and Psychotropics 5  Patient Care Equipment 0  Mobility-Assistance 2  Mobility-Gait 2  Mobility-Sensory Deficit 0  Cognition-Awareness 1  Cognition-Impulsiveness 2  Cognition-Limitations 4  Total Score 16  Patient's Fall Risk High Fall Risk (>13 points)  Adult Fall Risk Interventions  Required Bundle Interventions *See Row Information* High fall risk - low, moderate, and high requirements implemented  Additional Interventions 24 hour supervision/sitter

## 2014-08-18 NOTE — Progress Notes (Signed)
Nursing Note: Pt asleep.T-98.7 P-70 R-17 BP-132/73  PO2 99 % on r/a. Pt cbg 135 mg/dl.Sitter at the bedside.wbb

## 2014-08-18 NOTE — Progress Notes (Signed)
Nursing Note: Informed that pt fell the patient was with sitter Hannah,NT.Pt did not remember fall and denied injury.Hannah,NT states that pt got up to speak with other visitor in room and fell.Pt fell on his r side per Dahlia ClientHannah and did not bump his head.Pt was in the TV room at the time.Pt instructed that he needed to go to his room so that this nurse could assess him after his fall.Pt very reluctant and wanting to know why.I informed pt that it was hospital policy that I check him and his skin after a fall.Pt was reluctant but finally agreed.Pt's skin intact.Pt states he did not fall and did not remember it.Checked vitals and stable and checked pt's cbg and it was 430 mg/dl.A: Paged on-call Pam Love,PA and made her aware.Instructed to re-check cbg later since pt received novolog and lantus earlier.Notified Pam Love at 2240.wbb

## 2014-08-18 NOTE — Progress Notes (Signed)
Physical Therapy Session Note  Patient Details  Name: Derek AmbleLarry [name] MRN: [id number] Date of Birth: 1976-09-03  Today's Date: 08/18/2014 PT Individual Time: 1100-1204 and [id number] PT Individual Time Calculation (min): 64 min and 44 min  Short Term Goals: Week 1:  PT Short Term Goal 1 (Week 1): STGs=LTGs  Skilled Therapeutic Interventions/Progress Updates:    AM Session: Patient received sitting in recliner. Neuropsychologist present for session to assist with recommendations in behavior management. Patient received sitting in recliner in boxers only. Patient unwilling to perform any physical activities, citing his "mentality won't allow him to." Session focused on therapeutic conversation engaging patient with emphasis on long and short term memory, awareness of deficits, and awareness of improvements that need to be made prior to discharge. Patient with moments of raising his voice when expressing frustration, but no episode of agitation. Patient with language of confusion, oftentimes with difficulty word finding and many sentences not making any sense. Patient declined any mobility, physical activity, or self-care tasks (including getting dressed). Patient agreeable to therapy session this PM. Patient left sitting in recliner with sitter present.  PM Session: Patient received supine on couch in family room, asleep, with sitter present. First 30 minutes of session spent attempting to wake patient. Upon waking, patient reporting need to use bathroom. Functional ambulation 75'x2 (family room<>patient room) with patient's R UE around therapist, requires minA overall with several instances of mod-maxA for LOB. Patient continent of bladder and able to stand to wash hands. Patient returned to family room and returned to sitting on couch. Patient without any episodes of agitation during this session, even with therapist waking him. Patient cooperative and pleasant, making jokes and laughing with therapist.  Patient left sitting on couch with sitter present.  Therapy Documentation Precautions:  Precautions Precautions: Fall Precaution Comments: bouts of agitation Restrictions Weight Bearing Restrictions: No Pain: Pain Assessment Pain Assessment: 0-10 Pain Score:  (reports pain, but unable to rate) Locomotion : Ambulation Ambulation/Gait Assistance: 4: Min assist;3: Mod assist   See FIM for current functional status  Therapy/Group: Individual Therapy  Chipper HerbBridget S Mylz Yuan S. Aceton Kinnear, PT, DPT 08/18/2014, 4:35 PM

## 2014-08-18 NOTE — Progress Notes (Signed)
Nursing Note: Pt in and out cathed after unable to void.Volume was 600 cc.Pt cathed by Sarah,NT.wbb

## 2014-08-18 NOTE — Progress Notes (Signed)
Nursing Note: Pt has sitter present.No restraints in use as pt becomes more agitated with the use of restraints per report.Pt has safety sitter present at all times.wbb

## 2014-08-18 NOTE — Progress Notes (Signed)
Occupational Therapy Session Note  Patient Details  Name: [name] MRN: [id number] Date of Birth: Dec 23, 1975  Today's Date: 08/18/2014 OT Individual Time: [id number] and 1305-1400 OT Individual Time Calculation (min): 70 min and 55 min     Short Term Goals: Week 1:  OT Short Term Goal 1 (Week 1): Pt will sustain attention to self-care task for 30 seconds with max cues OT Short Term Goal 2 (Week 1): Pt will complete bathing task at supervision level with max cues  OT Short Term Goal 3 (Week 1): Pt will be oriented x3 with max cues  OT Short Term Goal 4 (Week 1): Pt will initiate one self-care task with min cues  Skilled Therapeutic Interventions/Progress Updates:    Session 1: Pt seen for OT session with focus on active participation, therapeutic conversation, functional mobility, and task completion. Pt received sitting in recliner chair. Engaged in therapeutic conversation in regards to current events and election (pt watching new on tv) and pt recalled 4 presidential candidates with increased time. Discussed pt's morning routine at home to facilitate participation. Pt reporting he would watch the news and eat breakfast then shower. Pt looking at clock then stating he wanted to shower at 9:00. Continued with therapeutic conversation with no agitation noted. Pt initiated bathing task at 9:00, requiring max cues for organization when picking out clothes to wear for the day. Pt completed bathing at supervision level from sit<>stand. Pt began LB dressing, donning boxers then requesting to use bathroom. Pt with increased frustration with difficulty having BM. Following toilet task pt requesting shoes. Pt began escalating because white shoes were slightly dirty. Pt refusing staff and therapist to enter bathroom to assist. Pt cleaning shoes while shouting curse words. Pt left sitting on TTB with 2 NT's to de-escalate.   Session 2: Pt seen for 1:1 OT session with focus on cognitive remediation and  overall active participation in therapy session. Pt received sitting in recliner chair with bright affect. Engaged in card game of "blackjack" with fake money and emphasis on attention, problem solving, and simple money management. Completed several games with pt demonstrating accuracy of adding cards 75% of time. After losing 2 games pt became slightly frustrated and added cards incorrectly to win game. Therapist did not correct pt to prevent escalation. Pt immediately demonstrating no frustration with game. Pt with 100% accuracy when counting money at end of game and with simple adding and subtracting using $5 and $1 bills. Pt required min cues to sustain attention to task for 45 min. At end of session, pt very pleasant asking therapist to stay longer and shaking her hand. Pt also with minimal cursing noted during session. Pt left with NT present.   Therapy Documentation Precautions:  Precautions Precautions: Fall Precaution Comments: bouts of agitation Restrictions Weight Bearing Restrictions: No General:   Vital Signs:   Pain: Pain Assessment Pain Assessment: 0-10 Pain Score:  (reports pain, but unable to rate) Pain Type: Acute pain Pain Location: Back Pain Orientation: Mid;Lower Pain Descriptors / Indicators: Aching;Sore Pain Frequency: Constant Pain Onset: With Activity Patients Stated Pain Goal: 5 Pain Intervention(s): Medication (See eMAR)  See FIM for current functional status  Therapy/Group: Individual Therapy  [name], [name] N 08/18/2014, 3:39 PM
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
## 2014-08-18 NOTE — Plan of Care (Signed)
Problem: RH BOWEL ELIMINATION Goal: RH STG MANAGE BOWEL W/MEDICATION W/ASSISTANCE STG Manage Bowel with Medication with Assistance.mod assist  Outcome: Not Progressing Sorbitol given.last bm 08/07/14
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
## 2014-08-18 NOTE — Progress Notes (Signed)
Inpatient Diabetes Program Recommendations  AACE/ADA: New Consensus Statement on Inpatient Glycemic Control (2013)  Target Ranges:  Prepandial:   less than 140 mg/dL      Peak postprandial:   less than 180 mg/dL (1-2 hours)      Critically ill patients:  140 - 180 mg/dL  Results for Derek AmbleHILLIPS, Colie (MRN [id number]) as of 08/18/2014 14:32  Ref. Range 08/18/2014 07:31 08/18/2014 10:51  Glucose-Capillary Latest Range: 70-99 mg/dL 409105 (H) 811422 (H)   Consider adding Novolog meal coverage 6 units TID for elevated postprandials.  Thank you  Piedad ClimesGina Yalexa Blust BSN, RN,CDE Inpatient Diabetes Coordinator [phone] (team pager)
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
## 2014-08-18 NOTE — Progress Notes (Signed)
Speech Language Pathology Daily Session Note  Patient Details  Name: [name] MRN: [id number] Date of Birth: 12/06/1975  Today's Date: 08/18/2014 SLP Individual Time: 1000-1055 SLP Individual Time Calculation (min): 55 min  Short Term Goals: Week 1: SLP Short Term Goal 1 (Week 1): Patient will request assistance with wants/needs verbally or by using call bell with Mod A.  SLP Short Term Goal 2 (Week 1): Patient will sustain attention to basic functional tasks for 10 minutes with Mod A. SLP Short Term Goal 3 (Week 1): Patient will demonstrate functional problem solving for basic and familiar tasks with Mod A. SLP Short Term Goal 4 (Week 1): Patient will utilize external visual aid to recall new, daily information with Mod A. SLP Short Term Goal 5 (Week 1): Patient will utilize word finding compensatory strategies at the phrase level with Mod A.  Skilled Therapeutic Interventions: Skilled treatment session focused on cognitive-linguistic goals. Upon arrival, patient was sitting upright in his recliner, mildly agitated while talking on the phone with a variety of family members.  SLP facilitated session by encouraging participation in treatment session by requesting get off the phone to order his lunch tray. The patient did not get off the phone at the time, however, he was engaging with this clinician in order to make appropriate item choices from his meal.  Patient eventually ended the telephone conversation with his mother, but was visibly agitated and tearful. Patient with limited verbal expression and declined all self-care tasks this clinician suggested (putting on clothes, etc), however, did engage in a limited conversation about a topic of interest. Patient independently requested the sitter to check his blood sugar and became agitated once again throughout the process. Session ended 5 minutes early. Continue with current plan of care.    FIM:  Comprehension Comprehension Mode:  Auditory Comprehension: 3-Understands basic 50 - 74% of the time/requires cueing 25 - 50%  of the time Expression Expression: 3-Expresses basic 50 - 74% of the time/requires cueing 25 - 50% of the time. Needs to repeat parts of sentences. Social Interaction Social Interaction: 2-Interacts appropriately 25 - 49% of time - Needs frequent redirection. Problem Solving Problem Solving: 2-Solves basic 25 - 49% of the time - needs direction more than half the time to initiate, plan or complete simple activities Memory Memory: 2-Recognizes or recalls 25 - 49% of the time/requires cueing 51 - 75% of the time  Pain No/Denies Pain   Therapy/Group: Individual Therapy  Olesya Wike 08/18/2014, 11:58 AM

## 2014-08-18 NOTE — Progress Notes (Signed)
Nursing Note: Pt's mom called and at that time I informed her of pt's fall.She informed me that pt had made inappropriate posting on-line and she stated that she did not want pt going back in the TV room.I informed her that I would pass on her wishes.

## 2014-08-18 NOTE — Care Management Note (Addendum)
[facility] Individual Statement of Services  Patient Name:  Derek AmbleLarry [name]  Date:  08/18/2014  Welcome to the [facility].  Our goal is to provide you with an individualized program based on your diagnosis and situation, designed to meet your specific needs.  With this comprehensive rehabilitation program, you will be expected to participate in at least 3 hours of rehabilitation therapies Monday-Friday, with modified therapy programming on the weekends.  Your rehabilitation program will include the following services:  Physical Therapy (PT), Occupational Therapy (OT), Speech Therapy (ST), 24 hour per day rehabilitation nursing, Therapeutic Recreaction (TR), Neuropsychology, Case Management (Social Worker), Rehabilitation Medicine, Nutrition Services and Pharmacy Services  Weekly team conferences will be held on Tuesdays to discuss your progress.  Your Social Worker will talk with you frequently to get your input and to update you on team discussions.  Team conferences with you and your family in attendance may also be held.  Expected length of stay: 2 weeks  Overall anticipated outcome: supervision  Depending on your progress and recovery, your program may change. Your Social Worker will coordinate services and will keep you informed of any changes. Your Social Worker's name and contact numbers are listed  below.  The following services may also be recommended but are not provided by the [facility]:   Driving Evaluations  Home Health Rehabiltiation Services  Outpatient Rehabilitation Services  Vocational Rehabilitation   Arrangements will be made to provide these services after discharge if needed.  Arrangements include referral to agencies that provide these services.  Your insurance has been verified to be:  None (to complete SSD and Medicaid applications) Your primary doctor is:  None  Pertinent information will be shared  with your doctor and your insurance company.  Social Worker:  [place], [place] [phone] or ([phone]) [phone]   Information discussed with and copy given to patient by: Amada JupiterHOYLE, Raza Bayless, 08/18/2014, 11:12 AM

## 2014-08-18 NOTE — Progress Notes (Signed)
Nursing Note: Pt with sitter in the TV room.Pt is calm but gets frustrated  Easily.Cursing  with basic conversation.wbb

## 2014-08-18 NOTE — Progress Notes (Addendum)
Nursing Note: Pt refused morning seroquel. Pt agitated,cursing and stated he never took this med before and that  he is allergic to it. Pt has been on med but does not remember.wbb

## 2014-08-18 NOTE — Progress Notes (Signed)
Nursing Note: Pt's cbg is 334 mg/dl.A: Paged on-call Pam  Love,PA and obtained an order for 5 units of novolog SQ  along with pt's scheduled lantus.wbb

## 2014-08-19 ENCOUNTER — Inpatient Hospital Stay (HOSPITAL_COMMUNITY): Payer: Non-veteran care | Admitting: Rehabilitation

## 2014-08-19 ENCOUNTER — Inpatient Hospital Stay (HOSPITAL_COMMUNITY): Payer: Non-veteran care

## 2014-08-19 ENCOUNTER — Inpatient Hospital Stay (HOSPITAL_COMMUNITY): Payer: Non-veteran care | Admitting: Occupational Therapy

## 2014-08-19 ENCOUNTER — Inpatient Hospital Stay (HOSPITAL_COMMUNITY): Payer: Non-veteran care | Admitting: Speech Pathology

## 2014-08-19 DIAGNOSIS — S069X4S Unspecified intracranial injury with loss of consciousness of 6 hours to 24 hours, sequela: Secondary | ICD-10-CM

## 2014-08-19 LAB — GLUCOSE, CAPILLARY
GLUCOSE-CAPILLARY: 194 mg/dL — AB (ref 70–99)
Glucose-Capillary: 147 mg/dL — ABNORMAL HIGH (ref 70–99)
Glucose-Capillary: 422 mg/dL — ABNORMAL HIGH (ref 70–99)
Glucose-Capillary: 275 mg/dL — ABNORMAL HIGH (ref 70–99)

## 2014-08-19 MED ORDER — INSULIN GLARGINE 100 UNIT/ML ~~LOC~~ SOLN
30.0000 [IU] | Freq: Every day | SUBCUTANEOUS | Status: DC
  Administered 2014-08-19 – 2014-08-24 (×6): 30 [IU] via SUBCUTANEOUS
  Filled 2014-08-19 (×8): qty 0.3

## 2014-08-19 NOTE — Progress Notes (Signed)
Physical Therapy Session Note  Patient Details  Name: [name] Ellner MRN: [id number] Date of Birth: [birth year]  Today's Date: 08/19/2014 PT Individual Time: 1400-1500 PT Individual Time Calculation (min): 60 min   Short Term Goals: Week 1:  PT Short Term Goal 1 (Week 1): STGs=LTGs  Skilled Therapeutic Interventions/Progress Updates:   Pt received sitting in recliner in room, agreeable to therapy session, but stating he needed to let "strawberry shortcake digest" before any physical activity.  Made agreement with pt to start with card game and then progress to more physical work out during session.  Pt engaged in "black jack" and "Dueces" per pt request. Pt did very well recalling all rules of game and continued to teach back to therapist, recalling who's turn it is, who is dealing and counting/managing money during session approx 75%. Also did card game with music playing and was able to alternate attention to singing song being played back to game and recall who's turn and what needed to happen next in game approx 75% of time.  Pt continues to demonstrate ability to tolerate losing during this session.  Pt ambulated to/from day room and to/from therapy gym at min/guard to close S level. No overt LOB during gait and pt able to follow multiple step commands at min A level while ambulating.  Pt then engage in physical workout with PT leading with // triceps dips, squats, push ups, and abdominal crunches with 3 lb medicine ball (did note difficulty with processing when PT providing instructions on how to perform this task).  Pt tolerated very well, despite gym being very crowded this afternoon. Pt very cooperative during session, engaging in appropriate conversation with therapist and tech with decreased amount of cussing. Pt ambulated back to room and left with sitter.     Therapy Documentation Precautions:  Precautions Precautions: Fall Precaution Comments: bouts of agitation Restrictions Weight  Bearing Restrictions: No   Pain: Pain Assessment Pain Assessment: 0-10 Pain Score: 2  Pain Location: Leg Pain Orientation: Left;Right Pain Onset: On-going Patients Stated Pain Goal: 2 Pain Intervention(s): Medication (See eMAR)   Locomotion : Ambulation Ambulation/Gait Assistance: 5: Supervision;4: Min guard   See FIM for current functional status  Therapy/Group: Individual Therapy  Vista Deckarcell, Chrisha Vogel Ann 08/19/2014, 4:01 PM

## 2014-08-19 NOTE — Progress Notes (Signed)
Occupational Therapy Session Note  Patient Details  Name: [name] MRN: [id number] Date of Birth: 1976-07-09  Today's Date: 08/19/2014 OT Individual Time: 0800-0900 OT Individual Time Calculation (min): 60 min    Short Term Goals: Week 1:  OT Short Term Goal 1 (Week 1): Pt will sustain attention to self-care task for 30 seconds with max cues OT Short Term Goal 2 (Week 1): Pt will complete bathing task at supervision level with max cues  OT Short Term Goal 3 (Week 1): Pt will be oriented x3 with max cues  OT Short Term Goal 4 (Week 1): Pt will initiate one self-care task with min cues  Skilled Therapeutic Interventions/Progress Updates:    Pt seen for 1:1 OT session with focus on awareness, activity tolerance, functional mobility, dynamic standing balance, and overall cognition. Pt received sitting EOB finishing breakfast and remembered therapist from playing cards yesterday. Pt frustrated over feeling that "some people here don't listen to me." Allowed pt to vent his frustrations and no agitation noted. Pt easily redirected to therapeutic activities. Ambulated to therapy gym with CGA-SBA with no LOB noted. Engaged in NuStep workload 8 for 10 min. Pt aware of increase in frustration and able to self manage using technique of watching therapist during task. Ambulated back to room with RUE over therapist as pt dribbled basketball with min assist. In room, engaged in ball passing game with pt requiring SBA for balance and 1 overt LOB requiring mod-max assist to correct. Therapist wrote pt's schedule for today with him reporting "that makes me feel better." Engaged in therapeutic conversation with pt demonstrating some awareness as he stated "If I need to stay longer than I will." Pt agreeable with therapist that he should not go home at this time d/t pain and balance deficits. Pt aware of word finding deficits throughout session, however, does not attribute this for requiring therapy. Pt pleasant  throughout session with no agitation noted. Pt left sitting in recliner chair with sitter present.  Therapy Documentation Precautions:  Precautions Precautions: Fall Precaution Comments: bouts of agitation Restrictions Weight Bearing Restrictions: No General:   Vital Signs:   Pain: Patient reporting pain in buttocks region, stating at times "It's real pain" but not rating it.  See FIM for current functional status  Therapy/Group: Individual Therapy  [name], Bralynn Velador N 08/19/2014, 9:14 AM

## 2014-08-19 NOTE — Progress Notes (Signed)
Occupational Therapy Session Note  Patient Details  Name: Derek AmbleLarry [name] MRN: [id number] Date of Birth: [birth year]  Today's Date: 08/19/2014 OT Individual Time: 1300-1330 OT Individual Time Calculation (min): 30 min    Short Term Goals: Week 1:  OT Short Term Goal 1 (Week 1): Pt will sustain attention to self-care task for 30 seconds with max cues OT Short Term Goal 2 (Week 1): Pt will complete bathing task at supervision level with max cues  OT Short Term Goal 3 (Week 1): Pt will be oriented x3 with max cues  OT Short Term Goal 4 (Week 1): Pt will initiate one self-care task with min cues  Skilled Therapeutic Interventions/Progress Updates:    1:1 Cognitive remediation: Engaged in card game of SPOT It with focus on processing speed, sustained to selective attention with door to room open, expression of language with naming of objects, recall of rules to game, frustration tolerance with speed of game against a component. Pt easily engaged in game 2x - pt maintained appropriate behavior throughout session despite challenging his thinking processing and ability to name objects with speed.   During session pt able to pick from two choices with success. Pt asked to go the bathroom and was able to pick from standing or sitting to urinate with rationale about how close he wanted to stand next to him.  (If picked sitting he could have more privacy.)   Pt asked to smoke outside but was able to rationalize on his own that he was not going to be allow to do that here without getting upset (no yelling or physically getting agitated). And was easily redirected to activity.   Therapy Documentation Precautions:  Precautions Precautions: Fall Precaution Comments: bouts of agitation Restrictions Weight Bearing Restrictions: No Pain: No indication of pain during session See FIM for current functional status  Therapy/Group: Individual Therapy  [name], Luc Shammas [facility] 08/19/2014, 3:07 PM

## 2014-08-19 NOTE — Progress Notes (Signed)
Physical Therapy Session Note  Patient Details  Name: [name] MRN: [id number] Date of Birth: 11/17/1975  Today's Date: 08/19/2014 PT Individual Time: 1100-1202 PT Individual Time Calculation (min): 62 min   Short Term Goals: Week 1:  PT Short Term Goal 1 (Week 1): STGs=LTGs  Skilled Therapeutic Interventions/Progress Updates:   Pt received using restroom, pt agitated and escalating with regards to getting insulin shot because pt states his blood sugar is 400.  RN in room to explain that he could not get insulin for 30 more mins and pt continued to escalate and cuss.  RN then provided pt with insulin and pt slowly able to de-escalate quickly and agreeable to therapy session.  Pt states he is too "worked up" to do physical activity, but agreeable to card game (to work on Amerisource[employer]ergen Corporationmemory, problem solving, sustained and alternating attention).  Pt engaged in "black jack" and "Duece, duece" per pt request.  Pt did very well recalling all rules of game and taught back to therapist, recalling who's turn it is, who is dealing and counting/managing money during session.  Pt initially did lie with amount on cards to win, but did very well from then on when therapist won two different games.  Pt even giving tips/tricks on "poker face" to assist with winning.  Pt ambulated to/from day room from his room at min/guard to close S level.  No overt LOB during gait and pt able to follow multiple step commands at min A level.  Pt very cooperative during session, engaging in appropriate conversation with therapist and tech with decreased amount of cussing.  He was also open to decrease cussing when cued by therapist.  Ambulated back to room and left in recliner with RN in room to take lunch order.    Therapy Documentation Precautions:  Precautions Precautions: Fall Precaution Comments: bouts of agitation Restrictions Weight Bearing Restrictions: No   Vital Signs: Therapy Vitals Temp: 97.5 F (36.4 C) Temp  Source: Oral Pulse Rate: 70 Resp: 18 BP: 125/72 mmHg Patient Position (if appropriate): Lying Oxygen Therapy SpO2: 100 % O2 Device: None (Room air) Pain: Pain Assessment Pain Assessment: 0-10 Pain Score: 10-Worst pain ever Pain Type: Acute pain Pain Location: Other (Comment) (body pain) Pain Descriptors / Indicators: Aching Pain Frequency: Intermittent Pain Onset: With Activity Patients Stated Pain Goal: 5 Pain Intervention(s): Medication (See eMAR)  See FIM for current functional status  Therapy/Group: Individual Therapy  Vista Deckarcell, Jediah Horger Ann 08/19/2014, 8:44 AM

## 2014-08-19 NOTE — Significant Event (Signed)
CRITICAL VALUE ALERT  Critical value received:  CBG  Date of notification:  today  Time of notification:  1010  Critical value read back:Yes.    Nurse who received alert:  Chana Bodeeborah Bridget   MD notified (1st page):  Dan FinlandAnguilla  Time of first page:  1115  MD notified (2nd page):  Time of second page:  Responding MD:  Dan FinlandAnguilla, PAC  Time MD responded:  1115 reviewed pt request to have blood sugars checked frequently and demanding insulin with every blood check; reviewed standing orders for CBG and insulin administration; worried that blood sugar will continue to rise and not be treated; agreed with administration of 9 units for > 400. Explained treatment to patient and need to space insulin out. Pamelia HoitSharp, Kyandra Mcclaine B '

## 2014-08-19 NOTE — Progress Notes (Signed)
Speech Language Pathology Daily Session Note  Patient Details  Name: [name] MRN: [id number] Date of Birth: Mar 13, 1976  Today's Date: 08/19/2014 SLP Individual Time: 1000-1100 SLP Individual Time Calculation (min): 60 min  Short Term Goals: Week 1: SLP Short Term Goal 1 (Week 1): Patient will request assistance with wants/needs verbally or by using call bell with Mod A.  SLP Short Term Goal 2 (Week 1): Patient will sustain attention to basic functional tasks for 10 minutes with Mod A. SLP Short Term Goal 3 (Week 1): Patient will demonstrate functional problem solving for basic and familiar tasks with Mod A. SLP Short Term Goal 4 (Week 1): Patient will utilize external visual aid to recall new, daily information with Mod A. SLP Short Term Goal 5 (Week 1): Patient will utilize word finding compensatory strategies at the phrase level with Mod A.  Skilled Therapeutic Interventions: Skilled treatment session focused on cognitive-linguistic goals. Upon arrival, patient was awake while supine in bed. He was in a pleasant mood and agreeable to participate in session. Patient independently requested that the nurse check his blood sugar, however, he became mildly verbally agitated when his insulin was not brought as promptly as he thought it should be and yelled out approximately every 20 minutes to the nurse, however, he was easily redirected. SLP facilitated session by providing Mod A question and verbal cues for functional problem solving and organization during a familiar card task. Patient also required Mod multimodal cues to verbally explain the rules of the task to this clinician and required Min A multimodal cues for word-finding during functional conversations.  Patient sustained attention to the task for ~30 minutes with Min A multimodal cues for redirection and demonstrated intermittent recall of events from previous therapy sessions with Mod A question cues. At end of session, patient  independently requested to use the bathroom and ambulated with supervision A, however, patient became verbally agitated when this clinician when not leave him alone in the bathroom. This clinician then stepped out of the bathroom and let the sitter provide supervision in order to deescalate the situation. Continue with current plan of care.     FIM:  Comprehension Comprehension Mode: Auditory Comprehension: 4-Understands basic 75 - 89% of the time/requires cueing 10 - 24% of the time Expression Expression Mode: Verbal Expression: 4-Expresses basic 75 - 89% of the time/requires cueing 10 - 24% of the time. Needs helper to occlude trach/needs to repeat words. Social Interaction Social Interaction: 3-Interacts appropriately 50 - 74% of the time - May be physically or verbally inappropriate. Problem Solving Problem Solving: 2-Solves basic 25 - 49% of the time - needs direction more than half the time to initiate, plan or complete simple activities Memory Memory: 3-Recognizes or recalls 50 - 74% of the time/requires cueing 25 - 49% of the time  Pain Pain Assessment Pain Assessment: 0-10 Pain Score: 2  Pain Location: Leg Pain Orientation: Left;Right Pain Onset: On-going Patients Stated Pain Goal: 2 Pain Intervention(s): Medication (See eMAR)  Therapy/Group: Individual Therapy  Shawntay Prest 08/19/2014, 3:59 PM

## 2014-08-19 NOTE — Progress Notes (Signed)
Inpatient Diabetes Program Recommendations  AACE/ADA: New Consensus Statement on Inpatient Glycemic Control (2013)  Target Ranges:  Prepandial:   less than 140 mg/dL      Peak postprandial:   less than 180 mg/dL (1-2 hours)      Critically ill patients:  140 - 180 mg/dL      Results for [name], [name] (MRN [id number]) as of 08/19/2014 10:16  Ref. Range 08/18/2014 07:31 08/18/2014 10:51 08/18/2014 16:01 08/18/2014 21:46  Glucose-Capillary Latest Range: 70-99 mg/dL 213105 (H) 086422 (H) 578328 (H) 505 (H)    Results for [name], [name] (MRN [id number]) as of 08/19/2014 10:16  Ref. Range 08/19/2014 05:45 08/19/2014 10:08  Glucose-Capillary Latest Range: 70-99 mg/dL 413147 (H) 244422 (H)     **Patient having severe postprandial glucose elevations.  Per MD note, "not eating appropriately---eats whatever he wants".  **Note that Lantus increased again today to Lantus 30 units QHS.  Not sure it would be best for patient to keep increasing Lantus insulin as patient's fasting glucose levels are fairly well controlled.  It may be better to add Novolog Meal Coverage to help with these postprandial glucose elevations.     MD- Please add Novolog Meal Coverage- Novolog 6 units tid with meals     Will follow Ambrose FinlandJeannine Johnston Ehab Humber RN, MSN, CDE Diabetes Coordinator Inpatient Diabetes Program Team Pager: [phone] (8a-10p)
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
## 2014-08-19 NOTE — Progress Notes (Signed)
Social Work Patient ID: Derek AmbleLarry [name], male   DOB: Nov 24, 1975, 38 y.o.   MRN: [id number]   Family Conference held today with pt's mother and two aunts who are to serve as the primary caregiver team.  Team members present included myself, OT Scherrie November(Kayla Perkinson), OT clinical specialist Roney Mans(Jennifer Smith), ST (Feliberto Gottronourtney Payne) and RN Carmie End(Angie Joyce).  A review of pt's current functional levels and deficits reviewed by tx team and questions from family answered.  Most discussion based on behavioral management of pt while here and then at home.  Family aware that team feels pt's behavioral issues are calming and pt now more manageable in terms of his ability to self calm when frustrated.  Mother concerned about his earlier behavior on unit but understands that we are encouraged by improvements so far and hope those prior issues are not a problem still by d/c.  Provided general TBI education with emphasis on family members interaction with pt going forward.  Plan to have family participate in "hands on" sessions next week and will offer more education at that time.  Plan still for pt to d/c home with mother as primary caregiver.  Kimie Pidcock, LCSW
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
## 2014-08-20 ENCOUNTER — Inpatient Hospital Stay (HOSPITAL_COMMUNITY): Payer: Non-veteran care | Admitting: Rehabilitation

## 2014-08-20 DIAGNOSIS — S0210XS Unspecified fracture of base of skull, sequela: Secondary | ICD-10-CM

## 2014-08-20 DIAGNOSIS — E119 Type 2 diabetes mellitus without complications: Secondary | ICD-10-CM

## 2014-08-20 LAB — GLUCOSE, CAPILLARY
GLUCOSE-CAPILLARY: 244 mg/dL — AB (ref 70–99)
GLUCOSE-CAPILLARY: 297 mg/dL — AB (ref 70–99)
Glucose-Capillary: 284 mg/dL — ABNORMAL HIGH (ref 70–99)
Glucose-Capillary: 329 mg/dL — ABNORMAL HIGH (ref 70–99)
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
## 2014-08-20 NOTE — Progress Notes (Signed)
Patient became highly agitated, that was escalating from begin of shift. At 2030 patient became verbally aggressive, using profanity and stating that he wants to go home. Security notified and arrived within minutes and nursing staff on stand-by. Pt told security guard, "I don't like you , get the f"ck out my room." Patient continues to be agitated and eventually sat in his chair. Pt is very quiet and stares at the TV. Attempted to offer haldol PRN, pt refused and requested lantus insulin only. Pt gave insulin to self. Pt remained in chair, and attempted to talk with patient and he told me to "get out of his fu**ing face and he never wanted to see me again." Another safety sitter arrived to sit with pt around 2100 who has worked with pt before. Pt currently is calm and playing cards with the sitter. Will cont to monitor. Vaani Morren, Phill MutterMelissa Rebecca

## 2014-08-20 NOTE — Progress Notes (Signed)
Physical Therapy Session Note  Patient Details  Name: [name] Calvey MRN: [id number] Date of Birth: 1976/07/17  Today's Date: 08/20/2014 PT Individual Time: 0945-1030 PT Individual Time Calculation (min): 45 min   Short Term Goals: Week 1:  PT Short Term Goal 1 (Week 1): STGs=LTGs  Skilled Therapeutic Interventions/Progress Updates:   Pt received sitting in chair in family room, sitter present and pt agreeable to therapy session.  Ambulated to/from room and then therapy gym at S level.  Donned shoes at therapist request prior to ambulating to therapy gym.  Performed seated nustep x 8 mins at level 7-8 resistance at pace of 80 steps per minute.  Pt cued for slower speed at higher resistance or higher speed at lower resistance.  Tolerated well, HR 117.  Pt then engaged in placing order for lunch, dinner and breakfast for next day.  Pt showing concerns regarding foods that may increase blood sugar and also voicing concern regarding amount of carbs.  Pt demonstrating good problem solving and interacting appropriately with nutrition services during session.  Remainder of session focused on dynamic balance and therex for overall strengthening, endurance, engaging in activity with using sustained and alternating attention and problem solving.  Performed ball toss/catch while standing on bosu ball at S level (started with basketball then with 3lb medicine ball).  Then performed // bar triceps dips x 10 reps, 20 push ups and B glute stretch.  Pt ambulated back to room and left with sitter present.    Therapy Documentation Precautions:  Precautions Precautions: Fall Precaution Comments: bouts of agitation Restrictions Weight Bearing Restrictions: No   Pain: Pt with no c/o pain during session.   See FIM for current functional status  Therapy/Group: Individual Therapy  Vista Deckarcell, Ileigh Mettler Ann 08/20/2014, 9:56 AM

## 2014-08-21 ENCOUNTER — Inpatient Hospital Stay (HOSPITAL_COMMUNITY): Payer: Non-veteran care | Admitting: Occupational Therapy

## 2014-08-21 ENCOUNTER — Encounter (HOSPITAL_COMMUNITY): Payer: Non-veteran care | Admitting: Occupational Therapy

## 2014-08-21 ENCOUNTER — Inpatient Hospital Stay (HOSPITAL_COMMUNITY): Payer: Non-veteran care | Admitting: Physical Therapy

## 2014-08-21 LAB — GLUCOSE, CAPILLARY
GLUCOSE-CAPILLARY: 353 mg/dL — AB (ref 70–99)
GLUCOSE-CAPILLARY: 148 mg/dL — AB (ref 70–99)
Glucose-Capillary: 121 mg/dL — ABNORMAL HIGH (ref 70–99)
Glucose-Capillary: 296 mg/dL — ABNORMAL HIGH (ref 70–99)
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
## 2014-08-21 NOTE — Progress Notes (Addendum)
Occupational Therapy Session Note  Patient Details  Name: [name] MRN: [id number] Date of Birth: 1976/05/22  Today's Date: 08/21/2014 OT Individual Time: 1000-1100  And 1300-1400 OT Individual Time Calculation (min): 60 min /60 min   Short Term Goals: Week 1:  OT Short Term Goal 1 (Week 1): Pt will sustain attention to self-care task for 30 seconds with max cues OT Short Term Goal 2 (Week 1): Pt will complete bathing task at supervision level with max cues  OT Short Term Goal 3 (Week 1): Pt will be oriented x3 with max cues  OT Short Term Goal 4 (Week 1): Pt will initiate one self-care task with min cues  Skilled Therapeutic Interventions/Progress Updates:    1:1 Pt getting his hair braided by the sitter when arrived. Pt recalled this therapist from Friday's session. Pt declined showering today and changing clothes. Pt became upset but didn't raise his voice about events from last night.  He reports he was told his mother was on rehab for a meeting on Friday but was not told about a meeting that he wanted to be apart of and why did his mother come see him. Once listened to the patient and his frustrations - he was able to be redirected to engaging in laundry task with min cuing for sequence of using the machine to wash his clothes. Proceeded to the family room and put on R&B music on through the computer. Pt then engaged in SPOT IT game with ability to verbalize directions with min cuing to rehab tech.  Pt required increased time to process with music on (moderate distracting environment) but with little awareness of its distractability even when asked.  Handed off patient to next therapist.  2nd session 13:00-14:00 1:1  Patient wanting to talk about episodes again about events from last night and sometimes not being heard.  Patient able to talk and did discuss things in an appropriate way and was able to state his thoughts at an appropriate tone of voice. Focused on frustration tolerance  and appropriate ways to handle frustration, memory recall of short term events, thought organization and carry through with tasks.  Discussed that we were looking for 3 things to occur before he left  His pain to be better, his balance to improve and his frustration level and thoughts to be clear  Patient agreed to this but still wanted to go home on Monday  Patient perseverative on going home on Monday  Patient able to be redirected in conversation to his son, finding football game on tv, and organizing a few things in his room.  Took his food left over from lunch to nurses station to be wrapped up and refrigerate and obtained his laundry from dryer with distant supervision..  At end of session asked again about using a phone,  therapist told him she had to see another patient but would come back in 2 hrs and we could talk about it more after she saw her patients  Patient agreed and gave her a hug.  Patient left with sitter.     Plan to discuss plan of how to handle use of phone with SW Lucy .  Called her in later afternoon (3 pm)to discuss plan.    Therapy Documentation Precautions:  Precautions Precautions: Fall Precaution Comments: bouts of agitation Restrictions Weight Bearing Restrictions: No Pain: Pain Assessment Pain Assessment: No/denies painin either session  See FIM for current functional status  Therapy/Group: Individual Therapy  [name], [name] 08/21/2014, 11:41  AM
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
## 2014-08-21 NOTE — Progress Notes (Signed)
1545 Patient appears to be in a euphoric like state and somewhat cooperative with some signs of paranoia, and irritation.  Dancing, singing in the halls through shift change. Patient and sitter encouraged to return to room to settle down for the evening.

## 2014-08-22 ENCOUNTER — Inpatient Hospital Stay (HOSPITAL_COMMUNITY): Payer: Non-veteran care

## 2014-08-22 ENCOUNTER — Inpatient Hospital Stay (HOSPITAL_COMMUNITY): Payer: Non-veteran care | Admitting: *Deleted

## 2014-08-22 LAB — GLUCOSE, CAPILLARY
GLUCOSE-CAPILLARY: 329 mg/dL — AB (ref 70–99)
GLUCOSE-CAPILLARY: 92 mg/dL (ref 70–99)
Glucose-Capillary: 257 mg/dL — ABNORMAL HIGH (ref 70–99)
Glucose-Capillary: 149 mg/dL — ABNORMAL HIGH (ref 70–99)
Glucose-Capillary: 86 mg/dL (ref 70–99)

## 2014-08-22 MED ORDER — VALPROIC ACID 250 MG PO CAPS
500.0000 mg | ORAL_CAPSULE | Freq: Two times a day (BID) | ORAL | Status: DC
  Administered 2014-08-22 – 2014-08-25 (×7): 500 mg via ORAL
  Filled 2014-08-22 (×9): qty 2

## 2014-08-22 NOTE — Progress Notes (Signed)
Speech Language Pathology Daily Session Note  Patient Details  Name: Derek AmbleLarry [name] MRN: [id number] Date of Birth: 1976/09/26  Today's Date: 08/22/2014 SLP Individual Time: 1130-1230 SLP Individual Time Calculation (min): 60 min  Short Term Goals: Week 1: SLP Short Term Goal 1 (Week 1): Patient will request assistance with wants/needs verbally or by using call bell with Mod A.  SLP Short Term Goal 2 (Week 1): Patient will sustain attention to basic functional tasks for 10 minutes with Mod A. SLP Short Term Goal 3 (Week 1): Patient will demonstrate functional problem solving for basic and familiar tasks with Mod A. SLP Short Term Goal 4 (Week 1): Patient will utilize external visual aid to recall new, daily information with Mod A. SLP Short Term Goal 5 (Week 1): Patient will utilize word finding compensatory strategies at the phrase level with Mod A.  Skilled Therapeutic Interventions: Skilled treatment focused on cognitive goals. Pt was seen by SLP during lunch meal to assess functional cognition. Pt demonstrated adequate basic problem solving and sustained attention for meal set-up and consumption, with no cues provided by SLP. Pt was noted to have intermittent bouts of anomia in conversation, for which SLP provided Min cues. Pt recalled instructions for card games he has learned during his rehab stay with supervision level cues and extra time. Pt initiated mildly complex money management task with Min cues for complex problem solving. Continue plan of care.   FIM:  Comprehension Comprehension Mode: Auditory Comprehension: 5-Follows basic conversation/direction: With no assist Expression Expression Mode: Verbal Expression: 5-Expresses basic 90% of the time/requires cueing < 10% of the time. Social Interaction Social Interaction: 4-Interacts appropriately 75 - 89% of the time - Needs redirection for appropriate language or to initiate interaction. Problem Solving Problem Solving:  5-Solves basic 90% of the time/requires cueing < 10% of the time Memory Memory: 4-Recognizes or recalls 75 - 89% of the time/requires cueing 10 - 24% of the time FIM - Eating Eating Activity: 5: Supervision/cues  Pain  No/denies pain  Therapy/Group: Individual Therapy   Maxcine HamLaura Paiewonsky, M.A. CCC-SLP [phone]  Maxcine Hamaiewonsky, Rheya Minogue 08/22/2014, 4:58 PM

## 2014-08-22 NOTE — Progress Notes (Signed)
Note reviewed and this therapist agrees with the information provided.  

## 2014-08-22 NOTE — Progress Notes (Signed)
Patient asleep until 0220. Patient sat on side of  Bed and patients agitation began to increase. Patient calm after offering/eating a snack. No complaints of pain. No HS medications given orally due to patient sleeping and not fully awake. adm

## 2014-08-22 NOTE — Progress Notes (Signed)
Occupational Therapy Session Note  Patient Details  Name: [name] MRN: [id number] Date of Birth: 1976/07/13  Today's Date: 08/22/2014 OT Individual Time: 2951-88410829-0930 OT Individual Time Calculation (min): 61 min    Short Term Goals: Week 1:  OT Short Term Goal 1 (Week 1): Pt will sustain attention to self-care task for 30 seconds with max cues OT Short Term Goal 2 (Week 1): Pt will complete bathing task at supervision level with max cues  OT Short Term Goal 3 (Week 1): Pt will be oriented x3 with max cues  OT Short Term Goal 4 (Week 1): Pt will initiate one self-care task with min cues  Skilled Therapeutic Interventions/Progress Updates:   Pt seen for 1:1 OT session with focus on attention to task, problem solving, and overall participation. Pt received supine in bed watching tv show. Pt declining turning tv show off, stating he would do it at 9:00. Therapist informed pt it was difficult for her to hear with it on, however, pt continued to decline. Allowed tv to remain on to prevent escalation. Pt initially perseverating on "wanting my stuff back" and easily redirected when therapist informed pt the staff was hoping to get his clothes back soon. Pt and therapist discussed tv show and weekend with pt having minimal work finding difficulties and able to self-correct. Once show was over, pt willing to compromise with therapist to turn volume down. Engaged in therapeutic card game of UNO sitting EOB with min cues for attention to task for 30 min. Pt with errors during game 2 times (mixed reverse and skip cards). Pt demonstrating good problem solving skills, turn taking, and following rules throughout game. Pt left sitting EOB with sitter present. Pt with no agitation or escalation noted during therapy session.    Therapy Documentation Precautions:  Precautions Precautions: Fall Precaution Comments: bouts of agitation Restrictions Weight Bearing Restrictions: No General:   Vital  Signs: Therapy Vitals Temp: 98.6 F (37 C) Temp Source: Oral Pulse Rate: 71 Resp: 18 BP: 142/88 mmHg Patient Position (if appropriate): Lying Oxygen Therapy SpO2: 100 % O2 Device: None (Room air) Pain: Pain Assessment Pain Assessment: No/denies pain  See FIM for current functional status  Therapy/Group: Individual Therapy  [name], [name] 08/22/2014, 9:42 AM

## 2014-08-22 NOTE — Progress Notes (Signed)
Occupational Therapy Session Note  Patient Details  Name: Derek AmbleLarry [name] MRN: [id number] Date of Birth: November 04, 1976  Today's Date: 08/22/2014 OT Individual Time: 2:00-2:30 OT Calculated Individual Time (min): 30 min   Short Term Goals: Week 1:  OT Short Term Goal 1 (Week 1): Pt will sustain attention to self-care task for 30 seconds with max cues OT Short Term Goal 2 (Week 1): Pt will complete bathing task at supervision level with max cues  OT Short Term Goal 3 (Week 1): Pt will be oriented x3 with max cues  OT Short Term Goal 4 (Week 1): Pt will initiate one self-care task with min cues  Skilled Therapeutic Interventions/Progress Updates:  Pt standing playing Wii game w/ sitter and therapy aid in room when arriving. Pt ambulated to day room and participated in exercise activity working on sequencing, sustained attention, command following, dynamic standing balance, endurance, and strengthening. Pt ambulated to family room and completed UB/LB exercises, demonstrating impulsivity but also ability to follow commands. Pt did not become agitated during session and ambulated back to room w/ sitter when leaving.  Therapy Documentation Precautions:  Precautions Precautions: Fall Precaution Comments: bouts of agitation Restrictions Weight Bearing Restrictions: No  See FIM for current functional status  Therapy/Group: Individual Therapy  [name] 08/22/2014, 2:47 PM
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
## 2014-08-22 NOTE — Progress Notes (Addendum)
Physical Therapy Session Note  Patient Details  Name: [name] Peraza MRN: [id number] Date of Birth: [birth year]  Today's Date: 08/22/2014 PT Individual Time: 1030-1115, 1300-1330, and 1515-1600 PT Individual Time Calculation (min): 45 min, 30 min, 45 min  Short Term Goals: Week 1:  PT Short Term Goal 1 (Week 1): STGs=LTGs  Skilled Therapeutic Interventions/Progress Updates:    AM Session: Patient received sitting EOB, starting Uno card game with rehab tech. Session focused on cognitive remediation with emphasis on short term memory/recall, sequencing, and problem solving with "teaching" card game to therapist. Patient able to explain rules of game initially and answer questions therapist posed throughout with min cues for accuracy with game rules. Patient sustained attention to task while alternating attention to conversation and others entering room.  At end of session, rec therapist present to set up TV and Ninetendo Wii in patient's room for added activity in room. This therapist explained rules of having Wii in his room (this is a privilege that can be taken away, safety, volume, and game cannot interfere with therapy). Patient verbalized understanding and very thankful for privilege. No bouts of agitation today, patient very cooperative and cheerful during game. Patient left sitting EOB and continuing game.  PM Session #1: Patient received sitting EOB, request to use toilet. Patient able to perform all aspects of toileting with supervision, including ambulation, transfer, clothing management, and hygiene; continent of bowel. Patient performed functional ambulation >200' without AD and without LOB, noted much improved steadiness in gait. Stair negotiation of 20 steps without handrails and supervision, alternating pattern. Ambulation while bouncing ball and tossing ball back and forth with therapist while naming foods that start with each letter of alphabet, patient requires min-mod cues to name  appropriate food.  Exercises: -declined push ups x25 -planking x60' -planking with LE movements  Patient returned to room and left sitting EOB with sitter present.  PM Session #2: Patient received supine in bed. Session focused on high level balance and strengthening: -pushing/pulling set of stairs with therapist 2x50' each direction -burpees x10 -squats with shoulder press with 8# weight x15 -NuStep Level 10 with B LE and then B LE and UEs x5'  Patient returned to room and left with sitter.  Therapy Documentation Precautions:  Precautions Precautions: Fall Precaution Comments: bouts of agitation Restrictions Weight Bearing Restrictions: No Pain: Pain Assessment Pain Assessment: No/denies pain Pain Score: 0-No pain Locomotion : Ambulation Ambulation/Gait Assistance: Not tested (comment)   See FIM for current functional status  Therapy/Group: Individual Therapy  Chipper HerbBridget S Abbigal Radich S. Ermine Stebbins, PT, DPT 08/22/2014, 12:14 PM

## 2014-08-23 ENCOUNTER — Inpatient Hospital Stay (HOSPITAL_COMMUNITY): Payer: Non-veteran care | Admitting: *Deleted

## 2014-08-23 ENCOUNTER — Inpatient Hospital Stay (HOSPITAL_COMMUNITY): Payer: Non-veteran care

## 2014-08-23 LAB — GLUCOSE, CAPILLARY
GLUCOSE-CAPILLARY: 328 mg/dL — AB (ref 70–99)
GLUCOSE-CAPILLARY: 384 mg/dL — AB (ref 70–99)
GLUCOSE-CAPILLARY: 410 mg/dL — AB (ref 70–99)
Glucose-Capillary: 172 mg/dL — ABNORMAL HIGH (ref 70–99)
Glucose-Capillary: 331 mg/dL — ABNORMAL HIGH (ref 70–99)
Glucose-Capillary: 135 mg/dL — ABNORMAL HIGH (ref 70–99)

## 2014-08-23 MED ORDER — INSULIN ASPART 100 UNIT/ML ~~LOC~~ SOLN
8.0000 [IU] | Freq: Once | SUBCUTANEOUS | Status: AC
  Administered 2014-08-23: 8 [IU] via SUBCUTANEOUS
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
## 2014-08-23 NOTE — Plan of Care (Signed)
Problem: RH Balance Goal: LTG Patient will maintain dynamic standing balance (PT) LTG: Patient will maintain dynamic standing balance with assistance during mobility activities (PT)  All functional mobility goals upgraded to mod I-Independent overall with recommendation of 24/7 supervision for safety/appropriate decision making.  Problem: RH Bed Mobility Goal: LTG Patient will perform bed mobility with assist (PT) LTG: Patient will perform bed mobility with assistance, with/without cues (PT).  All functional mobility goals upgraded to mod I-Independent overall with recommendation of 24/7 supervision for safety/appropriate decision making.  Problem: RH Bed to Chair Transfers Goal: LTG Patient will perform bed/chair transfers w/assist (PT) LTG: Patient will perform bed/chair transfers with assistance, with/without cues (PT).  All functional mobility goals upgraded to mod I- Independent overall with recommendation of 24/7 supervision for safety/appropriate decision making.  Problem: RH Car Transfers Goal: LTG Patient will perform car transfers with assist (PT) LTG: Patient will perform car transfers with assistance (PT).  All functional mobility goals upgraded to mod I- Independent overall with recommendation of 24/7 supervision for safety/appropriate decision making.  Problem: RH Furniture Transfers Goal: LTG Patient will perform furniture transfers w/assist (OT/PT LTG: Patient will perform furniture transfers with assistance (OT/PT).  All functional mobility goals upgraded to mod I- Independent overall with recommendation of 24/7 supervision for safety/appropriate decision making.  Problem: RH Floor Transfers Goal: LTG Patient will perform floor transfers w/assist (PT) LTG: Patient will perform floor transfers with assistance (PT).  All functional mobility goals upgraded to mod I- Independent overall with recommendation of 24/7 supervision for safety/appropriate decision  making.  Problem: RH Ambulation Goal: LTG Patient will ambulate in controlled environment (PT) LTG: Patient will ambulate in a controlled environment, # of feet with assistance (PT).  All functional mobility goals upgraded to mod I- Independent overall with recommendation of 24/7 supervision for safety/appropriate decision making. Goal: LTG Patient will ambulate in home environment (PT) LTG: Patient will ambulate in home environment, # of feet with assistance (PT).  All functional mobility goals upgraded to mod I- Independent overall with recommendation of 24/7 supervision for safety/appropriate decision making. Goal: LTG Patient will ambulate in community environment (PT) LTG: Patient will ambulate in community environment, # of feet with assistance (PT).  All functional mobility goals upgraded to mod I- Independent overall with recommendation of 24/7 supervision for safety/appropriate decision making.  Problem: RH Stairs Goal: LTG Patient will ambulate up and down stairs w/assist (PT) LTG: Patient will ambulate up and down # of stairs with assistance (PT)  All functional mobility goals upgraded to mod I- Independent overall with recommendation of 24/7 supervision for safety/appropriate decision making.

## 2014-08-23 NOTE — Plan of Care (Signed)
Problem: RH Balance Goal: LTG Patient will maintain dynamic standing balance (PT) LTG: Patient will maintain dynamic standing balance with assistance during mobility activities (PT)  All functional mobility goals upgraded to mod I- Independent overall with recommendation of 24/7 supervision for safety/appropriate decision making.

## 2014-08-23 NOTE — Plan of Care (Signed)
Problem: RH Balance Goal: LTG Patient will maintain dynamic standing with ADLs (OT) LTG: Patient will maintain dynamic standing balance with assist during activities of daily living (OT)  Upgraded to Mod I however still recommending 24/7 supervision for cognitive deficits.

## 2014-08-23 NOTE — Progress Notes (Signed)
Physical Therapy Session Note  Patient Details  Name: [name] MRN: [id number] Date of Birth: 1976-07-22  Today's Date: 08/23/2014 PT Individual Time: 1100-1200 and 1400-1445 PT Individual Time Calculation (min): 60 min and 45 min  Short Term Goals: Week 1:  PT Short Term Goal 1 (Week 1): STGs=LTGs  Skilled Therapeutic Interventions/Progress Updates:    AM Session: Patient received standing in family room with sitter, preparing tea. Session focused on community reintegration and community mobility. Patient performing at Independent level for all mobility, ambulation >1000, on/off unit, in lobby, outdoors and on uneven surfaces. Patient without any bouts of agitation or attempts to flee. Patient returned to room with sitter at end of session.  PM Session: Patient received sitting EOB. Session focused on administration of several outcome measures. SEE DETAILS BELOW FOR BERG, DGI, NORMAL TUG, MANUAL TUG, AND COGNITIVE TUG. Patient returned to room at end of session with sitter present.  Therapy Documentation Precautions:  Precautions Precautions: None Precaution Comments: bouts of agitation Restrictions Weight Bearing Restrictions: No Pain: Pain Assessment Pain Assessment: No/denies pain Pain Score: 0-No pain Locomotion : Ambulation Ambulation/Gait Assistance: 6: Modified independent (Device/Increase time)  Balance: Balance Balance Assessed: Yes Standardized Balance Assessment Standardized Balance Assessment: Berg Balance Test;Dynamic Gait Index;Timed Up and Go Test Berg Balance Test Sit to Stand: Able to stand without using hands and stabilize independently Standing Unsupported: Able to stand safely 2 minutes Sitting with Back Unsupported but Feet Supported on Floor or Stool: Able to sit safely and securely 2 minutes Stand to Sit: Sits safely with minimal use of hands Transfers: Able to transfer safely, minor use of hands Standing Unsupported with Eyes Closed: Able to  stand 10 seconds safely Standing Ubsupported with Feet Together: Able to place feet together independently and stand 1 minute safely From Standing, Reach Forward with Outstretched Arm: Can reach confidently >25 cm (10") From Standing Position, Pick up Object from Floor: Able to pick up shoe safely and easily From Standing Position, Turn to Look Behind Over each Shoulder: Looks behind from both sides and weight shifts well Turn 360 Degrees: Able to turn 360 degrees safely in 4 seconds or less Standing Unsupported, Alternately Place Feet on Step/Stool: Able to stand independently and safely and complete 8 steps in 20 seconds Standing Unsupported, One Foot in Front: Able to place foot tandem independently and hold 30 seconds Standing on One Leg: Able to lift leg independently and hold > 10 seconds Total Score: 56/56, indicating patient is not at risk for falls Dynamic Gait Index Level Surface: Normal Change in Gait Speed: Normal Gait with Horizontal Head Turns: Normal Gait with Vertical Head Turns: Normal Gait and Pivot Turn: Normal Step Over Obstacle: Normal Step Around Obstacles: Normal Steps: Normal Total Score: 24/24, indicating patient is not at risk for falls Timed Up and Go Test TUG: Normal TUG;Manual TUG;Cognitive TUG Normal TUG (seconds): 4.52 (average of 3 trials) Manual TUG (seconds): 4.62 (average of 3 trials) Cognitive TUG (seconds): 4.43 (average of 3 trials)  All TUG scores indicative of patient not at risk for falls.  See FIM for current functional status  Therapy/Group: Individual Therapy  Chipper HerbBridget S Gunther Zawadzki S. Maurica Omura, PT, DPT 08/23/2014, 2:55 PM

## 2014-08-23 NOTE — Progress Notes (Signed)
Nurse, Felipa Furnaceoug [place], RN educated patient on not eating high sugar cereals such as captain crunch and frosted flakes. Daijha Leggio A. Shatora Weatherbee, RN

## 2014-08-23 NOTE — Progress Notes (Signed)
Speech Language Pathology Daily Session Note  Patient Details  Name: Derek AmbleLarry [name] MRN: [id number] Date of Birth: 1976/08/27  Today's Date: 08/23/2014 SLP Individual Time: 0930-1030 SLP Individual Time Calculation (min): 60 min   Short Term Goals: Week 1: SLP Short Term Goal 1 (Week 1): Patient will request assistance with wants/needs verbally or by using call bell with Mod A.  SLP Short Term Goal 2 (Week 1): Patient will sustain attention to basic functional tasks for 10 minutes with Mod A. SLP Short Term Goal 3 (Week 1): Patient will demonstrate functional problem solving for basic and familiar tasks with Mod A. SLP Short Term Goal 4 (Week 1): Patient will utilize external visual aid to recall new, daily information with Mod A. SLP Short Term Goal 5 (Week 1): Patient will utilize word finding compensatory strategies at the phrase level with Mod A.  Skilled Therapeutic Interventions: Skilled treatment focused on cognitive-linguistic goals. SLP facilitated session with Min cues for alternating attention between various tasks and conversations. Pt prepared a drink for himself with supervision for sequencing, problem solving, scanning, and safety awareness. Pt with LOB x1 with SLP assistance to steady, after which he verbalized his need to work on his balance. Pt required Mod cues for aniticpatory awareness during conversation about discharge planning. He finished the remainder of the complex money management task from previous date with Min cues for complex problem solving. He required Mod cues for word-finding during complex linguistic task. Continue plan of care.   FIM:  Comprehension Comprehension Mode: Auditory Comprehension: 5-Follows basic conversation/direction: With extra time/assistive device Expression Expression Mode: Verbal Expression: 4-Expresses basic 75 - 89% of the time/requires cueing 10 - 24% of the time. Needs helper to occlude trach/needs to repeat words. Social  Interaction Social Interaction: 4-Interacts appropriately 75 - 89% of the time - Needs redirection for appropriate language or to initiate interaction. Problem Solving Problem Solving: 4-Solves basic 75 - 89% of the time/requires cueing 10 - 24% of the time Memory Memory: 4-Recognizes or recalls 75 - 89% of the time/requires cueing 10 - 24% of the time FIM - Eating Eating Activity: 5: Supervision/cues  Pain Pain Assessment Pain Assessment: No/denies pain Pain Score: 0-No pain  Therapy/Group: Individual Therapy   Maxcine HamLaura Paiewonsky, M.A. CCC-SLP [phone]  Maxcine Hamaiewonsky, [name] 08/23/2014, 12:46 PM

## 2014-08-23 NOTE — Progress Notes (Signed)
Inpatient Diabetes Program Recommendations  AACE/ADA: New Consensus Statement on Inpatient Glycemic Control (2013)  Target Ranges:  Prepandial:   less than 140 mg/dL      Peak postprandial:   less than 180 mg/dL (1-2 hours)      Critically ill patients:  140 - 180 mg/dL   Results for [name], [name] (MRN [id number]) as of 08/23/2014 10:25  Ref. Range 08/22/2014 06:53 08/22/2014 11:22 08/22/2014 16:31 08/22/2014 20:43 08/22/2014 20:45 08/23/2014 06:44  Glucose-Capillary Latest Range: 70-99 mg/dL 409257 (H) 811149 (H) 914329 (H) 92 86 172 (H)   Diabetes history: DM1 Outpatient Diabetes medications: Lantus 20 units QHS, Novolog TID sliding scale Current orders for Inpatient glycemic control: Lantus 30 units QHS, Novolog 0-9 units AC (note MD is allowing patient to let staff know how much insulin he needs based on glucose and meal intake)  Inpatient Diabetes Program Recommendations Insulin - Meal Coverage: Patient is a Type 1 diabetes; therefore he will need basal and bolus (for correction and meal coverage) insulin. Please consider ordering Novolog 3 units for meal coverage.  Thanks, Orlando PennerMarie Aaliah Jorgenson, RN, MSN, CCRN Diabetes Coordinator Inpatient Diabetes Program [phone] (Team Pager) [phone] ([place] office) [phone] [facility] office)

## 2014-08-23 NOTE — Progress Notes (Addendum)
Occupational Therapy Session Note  Patient Details  Name: Derek AmbleLarry Braithwaite MRN: [id number] Date of Birth: 05/30/1976  Today's Date: 08/23/2014 OT Individual Time: [id number] and 1300-1345 OT Individual Time Calculation (min): 60 min and 45 min     Short Term Goals: Week 1:  OT Short Term Goal 1 (Week 1): Pt will sustain attention to self-care task for 30 seconds with max cues OT Short Term Goal 2 (Week 1): Pt will complete bathing task at supervision level with max cues  OT Short Term Goal 3 (Week 1): Pt will be oriented x3 with max cues  OT Short Term Goal 4 (Week 1): Pt will initiate one self-care task with min cues  Skilled Therapeutic Interventions/Progress Updates:    Session 1: Pt seen for 1:1 OT session with focus on attention, safety awareness, functional mobility, and dynamic standing balance. Pt received supine in bed asleep, however easily aroused. Pt declining bathing as he showered last night. Pt retrieved clothing with increased time. Required supervision for LB dressing to sit and don pants has pt with slight LOB (self-corrected) when attempting in standing. Completed toilet transfer, toilet task, and grooming in standing. Ambulated to ADL apartment and engaged in simple meal task of cooking eggs and grits at stovetop. Pt completed task at supervision level and increased time. Pt demonstrated improved safety awareness as he checked burners multiples times to make sure they were not on or still hot. Pt required min cues for closing refrigerator and cabinets at times. Pt demonstrated alternating attention for 40 min as he engaged in therapeutic conversation while completing task. Pt left with SLP for next therapy session. Pt with no signs of agitation or poor frustration tolerance throughout session.   Session 2: Pt seen for 1:1 OT session with focus on community re-entry, activity tolerance, and safety awareness. Pt received sitting EOB. Ambulated to solarium, on/off elevators, and on  outside surface. Engaged in therapeutic conversation with pt demonstrating improved anticipatory awareness stating "I'm not going to have a lot of people over when I get home because it might be too much." Pt with no LOB and demonstrated good emergent awareness of managing around obstacles outside. Pt required min cues for way finding to locate elevator to return to room. Pt returned to room and reported feeling "frustrated" about not having computer to listen to music. Pt with no agitation noted. Provided radio in room and pt very excited and eager to use. Pt verbalized understanding of rules for keeping volume low and not allowing it to interfere with therapy. Pt set volume at appropriate level upon therapist leaving. Pt left with sitter present.  Therapy Documentation Precautions:  Precautions Precautions: None Precaution Comments: bouts of agitation Restrictions Weight Bearing Restrictions: No General:   Vital Signs:   Pain: Pain Assessment Pain Assessment: No/denies pain Pain Score: 0-No pain  See FIM for current functional status  Therapy/Group: Individual Therapy  [name], Christianna Belmonte N 08/23/2014, 12:08 PM
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
## 2014-08-24 ENCOUNTER — Inpatient Hospital Stay (HOSPITAL_COMMUNITY): Payer: Non-veteran care

## 2014-08-24 ENCOUNTER — Inpatient Hospital Stay (HOSPITAL_COMMUNITY): Payer: Non-veteran care | Admitting: Speech Pathology

## 2014-08-24 DIAGNOSIS — S069X4A Unspecified intracranial injury with loss of consciousness of 6 hours to 24 hours, initial encounter: Secondary | ICD-10-CM

## 2014-08-24 DIAGNOSIS — S0210XS Unspecified fracture of base of skull, sequela: Secondary | ICD-10-CM

## 2014-08-24 LAB — GLUCOSE, CAPILLARY
GLUCOSE-CAPILLARY: 86 mg/dL (ref 70–99)
Glucose-Capillary: 301 mg/dL — ABNORMAL HIGH (ref 70–99)
Glucose-Capillary: 217 mg/dL — ABNORMAL HIGH (ref 70–99)
Glucose-Capillary: 485 mg/dL — ABNORMAL HIGH (ref 70–99)
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
## 2014-08-24 NOTE — Progress Notes (Signed)
Inpatient Diabetes Program Recommendations  AACE/ADA: New Consensus Statement on Inpatient Glycemic Control (2013)  Target Ranges:  Prepandial:   less than 140 mg/dL      Peak postprandial:   less than 180 mg/dL (1-2 hours)      Critically ill patients:  140 - 180 mg/dL   Results for [name], [name] (MRN [id number]) as of 08/24/2014 12:37  Ref. Range 08/23/2014 06:44 08/23/2014 11:17 08/23/2014 14:48 08/23/2014 17:30 08/23/2014 21:13 08/23/2014 23:13  Glucose-Capillary Latest Range: 70-99 mg/dL 409172 (H) 811328 (H) 914331 (H) 135 (H) 384 (H) 410 (H)  Results for [name], [name] (MRN [id number]) as of 08/24/2014 12:37  Ref. Range 08/24/2014 07:09 08/24/2014 11:42  Glucose-Capillary Latest Range: 70-99 mg/dL 86 086301 (H)    Diabetes history: DM1  Outpatient Diabetes medications: Lantus 20 units QHS, Novolog TID sliding scale  Current orders for Inpatient glycemic control: Lantus 30 units QHS, Novolog 0-9 units AC (note MD is allowing patient to let staff know how much insulin he needs based on glucose and meal intake)    Inpatient Diabetes Program Recommendations Insulin - Meal Coverage: Patient is a Type 1 diabetes; therefore he will need basal and bolus (for correction and meal coverage) insulin. Please consider ordering Novolog 5 units for meal coverage.  Note: In reviewing CBG trend, it is obvious that post prandial glucose is consistently elevated and patient needs meal coverage. Please consider ordering Novolog 5 units for meal coverage to prevent CBG spiking up to over 300 after eating.   Thanks, Orlando PennerMarie Barbette Mcglaun, RN, MSN, CCRN Diabetes Coordinator Inpatient Diabetes Program [phone] (Team Pager) [phone] ([place] office) [phone] [facility] office)

## 2014-08-24 NOTE — Progress Notes (Signed)
Physical Therapy Session Note  Patient Details  Name: [name] MRN: [id number] Date of Birth: 09/05/1976  Today's Date: 08/24/2014 PT Individual Time: 0930-1030 PT Individual Time Calculation (min): 60 min  Session 2 Time: Time Calculation (min)  Short Term Goals: Week 2:  PT Short Term Goal 1 (Week 2): STGs=LTGs; goals upgraded to mod I overall for mobility, but continued recommendation for 24/7 supervision for safety/appropriate decision making  Skilled Therapeutic Interventions/Progress Updates:    Session 1: Pt received seated EOB, agreeable to participate in therapy. Car transfer independently in simulated car. Used cable machine for tricep presses 25# 30x, then lat pull downs 45# 30x w/ cues for slow movement. Negotiated up/down flight of stairs 3x independently. Gave pt choice of working on cardio or high level balance. Pt chose to work on balance. Pt stood on BOSU ball and passed basketball back and forth for 10'. Stood on BOSU ball and used body blade vertically, horizontally, and w/ diagonal PNF pattern. Worked on high level ambulation w/ floor ladder w/ chop steps, side stepping, high knees. Pt tolerated session well, reported satisfaction w/ completing high level activities. Pt instructed to think of activities he would like to do during PT session this PM while he is eating lunch. Pt ambulated back to room independently, left seated at EOB w/ sitter present and all needs within reach.   Session 2: Pt received seated EOB, agreeable to participate in therapy. Session focused on discharge planning and education w/ pt and pt's mother. Lots of emphasis on fact that despite strong progress made in functional mobility and standing balance, pt still needs assistance w/ higher level cognition and safety awareness. Mother educated on de-escalating agitation with quieting environment (lowering lights, sounds) and giving pt time to de-escalate himself. Pt ambulated around unit independently  w/ mother and therapist w/ appropriate room finding. Engaged in high level balance activities on BOSU ball w/ passing basketball back and forth. Pt returned to room and left in room w/ sitter and mother present.   Therapy Documentation Precautions:  Precautions Precautions: None Precaution Comments: bouts of agitation Restrictions Weight Bearing Restrictions: No General:   Vital Signs:   Pain: Pain Assessment Pain Assessment: No/denies pain Mobility: Bed Mobility Bed Mobility: Supine to Sit;Sit to Supine Supine to Sit: 7: Independent Sit to Supine: 7: Independent Transfers Sit to Stand: 7: Independent Stand to Sit: 7: Independent Locomotion :    Trunk/Postural Assessment : Cervical Assessment Cervical Assessment: Within Functional Limits Thoracic Assessment Thoracic Assessment: Within Functional Limits Lumbar Assessment Lumbar Assessment: Within Functional Limits Postural Control Postural Control: Within Functional Limits  Balance: Balance Balance Assessed: Yes Static Sitting Balance Static Sitting - Balance Support: No upper extremity supported;Feet supported;Feet unsupported Static Sitting - Level of Assistance: 7: Independent Dynamic Sitting Balance Dynamic Sitting - Balance Support: No upper extremity supported;Feet supported;Feet unsupported Dynamic Sitting - Level of Assistance: 7: Independent Static Standing Balance Static Standing - Balance Support: No upper extremity supported;During functional activity Static Standing - Level of Assistance: 7: Independent Dynamic Standing Balance Dynamic Standing - Balance Support: No upper extremity supported;During functional activity Dynamic Standing - Level of Assistance: 6: Modified independent (Device/Increase time) Exercises:   Other Treatments:    See FIM for current functional status  Therapy/Group: Individual Therapy  Hosie SpangleGodfrey, Irlanda Croghan Hosie SpangleJess Matis Monnier, PT, DPT 08/24/2014, 10:00 AM

## 2014-08-24 NOTE — Progress Notes (Signed)
Speech Language Pathology Discharge Summary  Patient Details  Name: [name] MRN: [id number] Date of Birth: [birth year]  Today's Date: 08/24/2014 SLP Individual Time: 1100-1130 SLP Individual Time Calculation (min): 30 min   Skilled Therapeutic Interventions: Skilled treatment session focused on addressing cognitive-linguistic goals. SLP facilitated session with Supervision cues for alternating attention between various tasks and conversations. Patient required Min cues for use of word finding strategies in a structured activity. Patient also required Mod cues for anticipatory awareness during conversation about discharge planning and needs following discharge. No family present for today's session however, SLP left handout for education/home recommendations for cognitive-linguistic deficits.    Patient has met 5 of 5 long term goals.  Patient to discharge at overall Supervision;Min level.  Reasons goals not met: n/a   Clinical Impression/Discharge Summary: Patient has made functional gains and has met 5 out of 5 long term goals this admission due to improved sustained and selective attention, recall, problem solving emergent awareness as well as ability to verbally express needs and wants. Patient is currently an overall Supervision-Min A for cognitive-linguistic tasks.  Patient education is ongoing due to poor anticipatory awareness and family was not present for education; however, a handout was left.  Patient will discharge home with 24 hour supervision from family.  Patient would benefit from continued outpatient follow up SLP services to maximize cognitive-linguistic functional abilities in order to maximize his functional independence.   Care Partner:  Caregiver Able to Provide Assistance: Yes  Type of Caregiver Assistance: Cognitive  Recommendation:  24 hour supervision/assistance;Outpatient SLP  Rationale for SLP Follow Up: Maximize functional communication;Reduce caregiver  burden;Maximize cognitive function and independence   Equipment: none   Reasons for discharge: Treatment goals met;Discharged from hospital   Patient/Family Agrees with Progress Made and Goals Achieved: Yes   See FIM for current functional status  Carmelia Roller., CCC-SLP [phone]  [place] 08/24/2014, 4:31 PM

## 2014-08-24 NOTE — Discharge Summary (Signed)
Discharge summary job # [id number]

## 2014-08-24 NOTE — Progress Notes (Signed)
Occupational Therapy Discharge Summary and Treatment Notes  Patient Details  Name: [name] MRN: [id number] Date of Birth: 1975-11-16  Today's Date: 08/24/2014 OT Individual Time: 5277-8242 and 1400-1505 OT Individual Time Calculation (min): 60 min and 65 min      Patient has met 10 of 10 long term goals due to improved activity tolerance, improved balance, postural control, improved attention, improved awareness and improved coordination.  Patient to discharge at overall Modified Independent level for self-care tasks and supervision for IADLs. Mother and patient aware of recommendation for 24/7 supervision.  Patient's care partner is independent to provide the necessary physical and cognitive assistance at discharge. Recommending 24/7 supervision due cognitive deficits and decreased safety awareness.   Reasons goals not met: N/A. All LTGs met.   Recommendation:  No skilled occupational therapy recommended at this time.   Equipment: No equipment provided  Reasons for discharge: treatment goals met and discharge from hospital  Patient/family agrees with progress made and goals achieved: Yes  Skilled Therapeutic Intervention Session 1: Pt seen for ADL retraining with focus on dynamic standing balance, functional transfers, and safety awareness. Pt received sitting EOB. Ambulated throughout room independently to retrieve clothing and bathing needs with increased time. Completed bathing at shower level in standing at Mod I level. Completed dressing at Mod I level with carryover of safety technique to don pants and socks in sitting. Ambulated to ADL apartment and completed tub transfer at Mod I level. Returned to room and left with sitter and all needs in reach.  Session 2: Pt seen for 1:1 OT session with focus on family education, dynamic balance, safety awareness, attention, and overall cognition. Pt received sitting EOB with mother present. Spent first 15-20 min using therapeutic  conversation with focus on education to both patient and mother. Emphasis on BI education, use of a routine at home, de-escalation techniques, safety concerns, etc. Mother verbalizing understanding and no questions at this time. Completed baking task in kitchen with focus on problem solving, attention, and safety. Pt followed directions correctly throughout task. Required supervision cues for safety in kitchen to close cabinets and turn oven off. Pt demonstrated appropriate problem solving when adapting recipe to adjust to available ingredients. Pt requiring supervision cues for selective attention up to 40 min during therapeutic activity. Pt ambulated independently back to apartment requiring mod cues for safety and attention as he was distracted by others in hallway. Pt left in room with mother and CSW. No questions from mother at this time.   OT Discharge Precautions/Restrictions  Precautions Precautions: None Precaution Comments: bouts of agitation Restrictions Weight Bearing Restrictions: No General   Vital Signs Therapy Vitals Temp: 98.5 F (36.9 C) Temp Source: Oral Pulse Rate: 73 Resp: 18 BP: 125/85 mmHg Patient Position (if appropriate): Lying Oxygen Therapy SpO2: 100 % O2 Device: None (Room air) Pain Pain Assessment Pain Assessment: No/denies pain ADL   Vision/Perception  Vision- History Baseline Vision/History: No visual deficits Patient Visual Report: No change from baseline Vision- Assessment Vision Assessment?: No apparent visual deficits Perception Comments: no deficits noted  Cognition Overall Cognitive Status: Impaired/Different from baseline Arousal/Alertness: Awake/alert Orientation Level: Oriented to person;Oriented to place;Oriented to situation;Disoriented to time Attention: Selective;Alternating Focused Attention: Appears intact Sustained Attention: Appears intact Selective Attention: Appears intact Alternating Attention: Impaired Alternating  Attention Impairment: Verbal basic;Functional basic Memory: Impaired Memory Impairment: Decreased recall of new information Decreased Short Term Memory: Verbal basic;Functional basic Awareness: Impaired Awareness Impairment: Anticipatory impairment Problem Solving: Impaired Problem Solving Impairment: Functional complex Executive  Function: Reasoning;Decision Making;Self Monitoring Reasoning: Impaired Reasoning Impairment: Verbal complex;Functional complex Decision Making: Impaired Decision Making Impairment: Functional complex Self Monitoring Impairment: Functional complex Behaviors: Poor frustration tolerance;Impulsive Safety/Judgment: Impaired Rancho [employer] Scales of Cognitive Functioning: Automatic/appropriate Sensation Sensation Light Touch: Appears Intact Hot/Cold: Appears Intact Coordination Gross Motor Movements are Fluid and Coordinated: Yes Fine Motor Movements are Fluid and Coordinated: Yes Motor  Motor Motor: Within Functional Limits Mobility  Bed Mobility Bed Mobility: Supine to Sit;Sit to Supine Supine to Sit: 7: Independent Sit to Supine: 7: Independent Transfers Sit to Stand: 7: Independent Stand to Sit: 7: Independent  Trunk/Postural Assessment  Cervical Assessment Cervical Assessment: Within Functional Limits Thoracic Assessment Thoracic Assessment: Within Functional Limits Lumbar Assessment Lumbar Assessment: Within Functional Limits Postural Control Postural Control: Within Functional Limits  Balance Balance Balance Assessed: Yes Static Sitting Balance Static Sitting - Balance Support: No upper extremity supported;Feet supported;Feet unsupported Static Sitting - Level of Assistance: 7: Independent Dynamic Sitting Balance Dynamic Sitting - Balance Support: No upper extremity supported;Feet supported;Feet unsupported Dynamic Sitting - Level of Assistance: 7: Independent Static Standing Balance Static Standing - Balance Support: No upper  extremity supported;During functional activity Static Standing - Level of Assistance: 7: Independent Dynamic Standing Balance Dynamic Standing - Balance Support: No upper extremity supported;During functional activity Dynamic Standing - Level of Assistance: 6: Modified independent (Device/Increase time) Extremity/Trunk Assessment RUE Assessment RUE Assessment: Within Functional Limits (5/5 strength) LUE Assessment LUE Assessment: Within Functional Limits (5/5 strength)  See FIM for current functional status  Silveria Botz N 08/24/2014, 9:00 AM
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
## 2014-08-25 ENCOUNTER — Encounter (HOSPITAL_COMMUNITY): Payer: Non-veteran care

## 2014-08-25 LAB — GLUCOSE, CAPILLARY
GLUCOSE-CAPILLARY: 228 mg/dL — AB (ref 70–99)
GLUCOSE-CAPILLARY: 166 mg/dL — AB (ref 70–99)
Glucose-Capillary: >600 mg/dL (ref 70–99)
Glucose-Capillary: 49 mg/dL — ABNORMAL LOW (ref 70–99)

## 2014-08-25 MED ORDER — NICOTINE 21 MG/24HR TD PT24: MEDICATED_PATCH | TRANSDERMAL | Status: AC

## 2014-08-25 MED ORDER — INSULIN ASPART 100 UNIT/ML ~~LOC~~ SOLN
8.0000 [IU] | Freq: Once | SUBCUTANEOUS | Status: AC
  Administered 2014-08-25: 8 [IU] via SUBCUTANEOUS

## 2014-08-25 MED ORDER — VALPROIC ACID 250 MG PO CAPS: 500.0000 mg | ORAL_CAPSULE | Freq: Two times a day (BID) | ORAL | Status: AC

## 2014-08-25 MED ORDER — HYDROCODONE-ACETAMINOPHEN 10-325 MG PO TABS: 0.5000 | ORAL_TABLET | ORAL | Status: DC | PRN

## 2014-08-25 MED ORDER — METHOCARBAMOL 500 MG PO TABS: 500.0000 mg | ORAL_TABLET | Freq: Four times a day (QID) | ORAL | Status: AC | PRN

## 2014-08-25 MED ORDER — INSULIN GLARGINE 100 UNIT/ML ~~LOC~~ SOLN: 30.0000 [IU] | Freq: Every day | SUBCUTANEOUS | Status: AC

## 2014-08-25 MED ORDER — QUETIAPINE FUMARATE 100 MG PO TABS: 100.0000 mg | ORAL_TABLET | Freq: Three times a day (TID) | ORAL | Status: AC

## 2014-08-25 NOTE — Progress Notes (Signed)
Inpatient Diabetes Program Recommendations  AACE/ADA: New Consensus Statement on Inpatient Glycemic Control (2013)  Target Ranges:  Prepandial:   less than 140 mg/dL      Peak postprandial:   less than 180 mg/dL (1-2 hours)      Critically ill patients:  140 - 180 mg/dL   Results for [name], [place] (MRN [id number]) as of 08/25/2014 08:23  Ref. Range 08/24/2014 07:09 08/24/2014 11:42 08/24/2014 15:53 08/24/2014 20:55 08/25/2014 02:03 08/25/2014 04:20 08/25/2014 07:02  Glucose-Capillary Latest Range: 70-99 mg/dL 86 132301 (H) 440217 (H) 102485 (H) >600 (HH) 49 (L) 228 (H)   Diabetes history: DM1  Outpatient Diabetes medications: Lantus 20 units QHS, Novolog TID sliding scale  Current orders for Inpatient glycemic control: Lantus 30 units QHS, Novolog 0-9 units AC (note MD is allowing patient to let staff know how much insulin he needs based on glucose and meal intake)   Inpatient Diabetes Program Recommendations  Insulin - Meal Coverage: Patient has Type 1 diabetes and is sensitive to insulin.  Patient will need basal and bolus (for correction and meal coverage) insulin. Please consider ordering Novolog 5 units for meal coverage.  Insulin-Correction: Please order Novolog bedtime correction scale.   Note: CBGs ranged from 86-485 mg/dl on 72/5310/14 and CBG noted to be over 600 mg/dl at 6:642:03 this morning. In reviewing CBG trend, it appears post prandial glucose is consistently elevated and patient needs meal coverage. If meal coverage was ordered and given it would help prevent spikes in glucose after eating which will help stabilize glucose. Please consider ordering Novolog 5 units for meal coverage to prevent CBG spiking up to over 400 after eating.   Thanks, Orlando PennerMarie Darlin Stenseth, RN, MSN, CCRN Diabetes Coordinator Inpatient Diabetes Program [phone] (Team Pager) [phone] (AP office) [phone] [facility] office)
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
## 2014-08-25 NOTE — Significant Event (Signed)
CBG (last 3)   Recent Labs  08/24/14 2055 08/25/14 0203 08/25/14 0420  GLUCAP 485* >600* 49*

## 2014-08-25 NOTE — Progress Notes (Signed)
Social Work  Discharge Note  The overall goal for the admission was met for:   Discharge location: Yes - home with mother who will provide 24/7 supervision  Length of Stay: Yes - 10 days  Discharge activity level: Yes - supervision to independent  Home/community participation: Yes   Services provided included: MD, RD, PT, OT, SLP, RN, TR, Pharmacy, Neuropsych and SW  Financial Services: Other: [facility]  Follow-up services arranged: Outpatient: [facility] recommended - instructed pt to pursue via [place] [place] referral in order to pursue coverage  Comments (or additional information):  Provided information on local and state TBI resources  Patient/Family verbalized understanding of follow-up arrangements: Yes  Individual responsible for coordination of the follow-up plan: pt/ mother  Confirmed correct DME delivered: NA - no needs    [name]

## 2014-08-25 NOTE — Discharge Instructions (Signed)
Inpatient Rehab Discharge Instructions  Derek AmbleLarry [name] Discharge date and time: No discharge date for patient encounter.   Activities/Precautions/ Functional Status: Activity: activity as tolerated Diet: diabetic diet Wound Care: none needed Functional status:  ___ No restrictions     ___ Walk up steps independently _x__ 24/7 supervision/assistance   ___ Walk up steps with assistance ___ Intermittent supervision/assistance  ___ Bathe/dress independently ___ Walk with walker     ___ Bathe/dress with assistance ___ Walk Independently    ___ Shower independently ___ Walk with assistance    ___ Shower with assistance ___ No alcohol     ___ Return to work/school ________   COMMUNITY REFERRALS UPON DISCHARGE:    Outpatient:  Speech Therapy follow up is recommended and could be provided at [facility] [phone])                           [facility] [place] [facility] and request referral for these services   GENERAL COMMUNITY RESOURCES FOR PATIENT/FAMILY:  Support Groups: Brain Injury Support Groups - "Back on Track"  -- contact [phone] for schedule and location    Caregiver Support: same as above      Special Instructions:  No smoking or alcohol No driving  My questions have been answered and I understand these instructions. I will adhere to these goals and the provided educational materials after my discharge from the hospital.  Patient/Caregiver Signature _______________________________ Date __________  Clinician Signature _______________________________________ Date __________  Please bring this form and your medication list with you to all your follow-up doctor's appointments.

## 2014-08-25 NOTE — Progress Notes (Signed)
Patient and mother received discharge instructions from Deatra Inaan Angiulli, PA-C and Amada JupiterLucy Hoyle, SW with verbal understanding. Patient discharged to home with mother and belongings. Patient received belongings from security in ED.

## 2014-08-26 NOTE — Progress Notes (Signed)
Physical Therapy Discharge Summary  Patient Details  Name: [name] MRN: [id number] Date of Birth: [birth year]  Today's Date: 08/26/2014  Patient has met 59 of 13 long term goals due to improved activity tolerance, improved balance, improved postural control, increased strength, decreased pain, ability to compensate for deficits, improved attention, improved awareness, improved coordination and improved behavior.  Patient to discharge at an ambulatory level Independent.   Patient's care partner not necessary to provide the necessary assistance at discharge. Patient to discharge at [place] I level with recommendations for 24/7 supervision for safety/appropriate decision making.  Reasons goals not met: N/A, all LTGs met  Recommendation:  No follow up PT recommended.  Equipment: No equipment provided  Reasons for discharge: treatment goals met and discharge from hospital  Patient/family agrees with progress made and goals achieved: Yes  PT Discharge Precautions/Restrictions Precautions Precautions: None Precaution Comments: bouts of agitation Restrictions Weight Bearing Restrictions: No Vision/Perception    See OT note Cognition Overall Cognitive Status: Impaired/Different from baseline Arousal/Alertness: Awake/alert Orientation Level: Oriented to person;Oriented to place;Oriented to situation;Disoriented to time Attention: Selective;Alternating Focused Attention: Appears intact Focused Attention Impairment: Verbal basic;Functional basic Sustained Attention: Appears intact Sustained Attention Impairment: Verbal basic;Functional basic Selective Attention: Appears intact Alternating Attention: Impaired Alternating Attention Impairment: Verbal basic;Functional basic Memory: Impaired Memory Impairment: Decreased recall of new information Decreased Short Term Memory: Verbal basic;Functional basic Awareness: Impaired Awareness Impairment: Anticipatory  impairment Problem Solving: Impaired Problem Solving Impairment: Functional complex Executive Function: Reasoning;Decision Making;Self Monitoring Reasoning: Impaired Reasoning Impairment: Verbal complex;Functional complex Decision Making: Impaired Decision Making Impairment: Functional complex Self Monitoring: Impaired Self Monitoring Impairment: Functional complex Behaviors: Poor frustration tolerance;Impulsive Safety/Judgment: Impaired Comments: All cognition information obtained from SLP eval; unable to obtain secondary to [facility] Scales of Cognitive Functioning: Automatic/appropriate Sensation Sensation Light Touch: Appears Intact Hot/Cold: Appears Intact Additional Comments: difficult to assess d/t cognitive impairments Coordination Gross Motor Movements are Fluid and Coordinated: Yes Fine Motor Movements are Fluid and Coordinated: Yes Motor  Motor Motor: Within Functional Limits  Mobility Bed Mobility Bed Mobility: Supine to Sit;Sit to Supine Supine to Sit: 7: Independent Supine to Sit Details: Tactile cues for initiation Sit to Supine: 7: Independent Sit to Supine - Details: Tactile cues for initiation Transfers Transfers: Yes Sit to Stand: 7: Independent Sit to Stand Details: Tactile cues for initiation Stand to Sit: 7: Independent Stand to Sit Details (indicate cue type and reason): Tactile cues for initiation Stand Pivot Transfers: 7: Independent Stand Pivot Transfer Details: Tactile cues for initiation;Verbal cues for precautions/safety Locomotion  Ambulation Ambulation: Yes Ambulation/Gait Assistance: 7: Independent Ambulation Distance (Feet): 500 Feet Assistive device: None Ambulation/Gait Assistance Details: Verbal cues for precautions/safety;Visual cues/gestures for precautions/safety Gait Gait: Yes Gait Pattern: Within Functional Limits Gait Pattern: Step-through pattern;Narrow base of support;Scissoring Stairs / Additional  Locomotion Stairs: Yes Stairs Assistance: 7: Independent Stair Management Technique: No rails Number of Stairs: 12 Height of Stairs: 6 Wheelchair Mobility Wheelchair Mobility: No (patient ambulatory)  Trunk/Postural Assessment  Cervical Assessment Cervical Assessment: Within Functional Limits Thoracic Assessment Thoracic Assessment: Within Functional Limits Lumbar Assessment Lumbar Assessment: Within Functional Limits Postural Control Postural Control: Within Functional Limits  Balance Balance Balance Assessed: Yes Standardized Balance Assessment Standardized Balance Assessment: Berg Balance Test;Dynamic Gait Index;Timed Up and Go Test Berg Balance Test Sit to Stand: Able to stand without using hands and stabilize independently Standing Unsupported: Able to stand safely 2 minutes Sitting with Back Unsupported but Feet Supported on Floor or Stool: Able to sit safely  and securely 2 minutes Stand to Sit: Sits safely with minimal use of hands Transfers: Able to transfer safely, minor use of hands Standing Unsupported with Eyes Closed: Able to stand 10 seconds safely Standing Ubsupported with Feet Together: Able to place feet together independently and stand 1 minute safely From Standing, Reach Forward with Outstretched Arm: Can reach confidently >25 cm (10") From Standing Position, Pick up Object from Floor: Able to pick up shoe safely and easily From Standing Position, Turn to Look Behind Over each Shoulder: Looks behind from both sides and weight shifts well Turn 360 Degrees: Able to turn 360 degrees safely in 4 seconds or less Standing Unsupported, Alternately Place Feet on Step/Stool: Able to stand independently and safely and complete 8 steps in 20 seconds Standing Unsupported, One Foot in Front: Able to place foot tandem independently and hold 30 seconds Standing on One Leg: Able to lift leg independently and hold > 10 seconds Total Score: 56/56, indicating patient is not at  risk for falls Dynamic Gait Index Level Surface: Normal Change in Gait Speed: Normal Gait with Horizontal Head Turns: Normal Gait with Vertical Head Turns: Normal Gait and Pivot Turn: Normal Step Over Obstacle: Normal Step Around Obstacles: Normal Steps: Normal Total Score: 24/24, indicating patient is not at risk for falls Timed Up and Go Test TUG: Normal TUG;Manual TUG;Cognitive TUG Normal TUG (seconds): 4.52 (average of 3 trials),  indicating patient is not at risk for falls Manual TUG (seconds): 4.62 (average of 3 trials),  indicating patient is not at risk for falls Cognitive TUG (seconds): 4.43 (average of 3 trials), indicating patient is not at risk for falls Static Sitting Balance Static Sitting - Balance Support: No upper extremity supported;Feet supported;Feet unsupported Static Sitting - Level of Assistance: 7: Independent Dynamic Sitting Balance Dynamic Sitting - Balance Support: No upper extremity supported;Feet supported;Feet unsupported Dynamic Sitting - Level of Assistance: 7: Independent Static Standing Balance Static Standing - Balance Support: No upper extremity supported;During functional activity Static Standing - Level of Assistance: 7: Independent Dynamic Standing Balance Dynamic Standing - Balance Support: No upper extremity supported;During functional activity Dynamic Standing - Level of Assistance: 6: Modified independent (Device/Increase time) Dynamic Standing - Balance Activities: Reaching for objects;Forward lean/weight shifting;Lateral lean/weight shifting;Reaching for weighted objects;Reaching across midline Extremity Assessment  RUE Assessment RUE Assessment: Within Functional Limits (5/5 strength) LUE Assessment LUE Assessment: Within Functional Limits (5/5 strength) RLE Assessment RLE Assessment: Within Functional Limits LLE Assessment LLE Assessment: Within Functional Limits  See FIM for current functional status  Naviyah Schaffert S Flint Melter.  Amil Moseman, PT, DPT 08/26/2014, 1:15 PM

## 2014-08-30 NOTE — Consult Note (Signed)
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  Several behavioral modification recommendations were provided as well as staff education.  Since that evaluation, staff members reported noticing a vast improvement in patient behavior and requested the current evaluation in order to document potential lingering cognitive deficits prior to his discharge.  PROCEDURES: [3 units of [id number] on 08/25/14]  The following tests were performed during today's visit: Mini Mental Status Examination (brief version), Repeatable Battery for the Assessment of Neuropsychological Status (RBANS, form D), Beck Depression Inventory (fast screen for medical patients).  Performance validity indicators were also assessed.  Test results are as follows:   MMSE-2 (brief) Raw Score = 15/16 Description = WNL   RBANS Indices Scaled Score Percentile Description  Immediate Memory  49 < 1 Profoundly Impaired  Visuospatial/Constructional 75 5 Impaired  Language 78 7 Impaired  Attention 56 < 1 Profoundly Impaired   Delayed Memory 52 < 1 Profoundly Impaired  Total Score 53 < 1 Profoundly Impaired   RBANS Subtests Raw Score Percentile Description  List Learning 15 < 1 Profoundly Impaired  Story Memory 8 < 1 Profoundly Impaired  Figure Copy 16 1 Profoundly Impaired  Line Orientation 18 63 Average  Picture Naming 10 70 Average  Semantic Fluency 14 2 Profoundly Impaired  Digit Span 6 1 Profoundly Impaired  Coding 42 5 Impaired  List Recall 2 < 1 Profoundly Impaired  List Recognition 18 < 1 Profoundly Impaired  Story Recall 2 < 1 Profoundly Impaired  Figure recall 15 34 Average   BDI-fast Raw Score = 2/20 Description = WNL   [name]' performances on objective and embedded measures of performance  validity were within expected limits and there were no behavioral manifestations to suggest suboptimal effort.  However, he seemed highly fatigued as there were times when he seemed to be falling asleep during the evaluation.  He commented that he had just taken Seroquel.  Therefore, the current results should be interpreted with some caution, as his scores may represent an underestimation of his true cognitive potential.    [name]' performances across cognitive domains were predominantly impaired, though a very brief measure of overall mental status was intact.  He demonstrated most difficulty with executive functioning, including ability to encode novel information.  Additionally, his free recall of information was poor, though he demonstrated significant benefit from provision of recognition cues.    [name]' performances were intact on measures of spatial judgment and simple confrontation naming.  Of note, on a measure of complex visual organization, his score was adversely impacted by the fact that he took a haphazard approach to copying a complex figure.  Therefore, his visuospatial functioning in general, is likely intact.  From an emotional standpoint, there was no evidence of clinically significant depression at this time.    Owing to the nature and extent of the brain injury that [name] sustained, it is not surprising that his cognitive functioning, particularly executive functioning, processing speed and memory encoding, was impaired.  It is expected that with time, he will experience improvement in cognitive abilities.  In addition, owing to possible impact from side effects (e.g. fatigue) from Seroquel, it is possible that his cognitive functioning may significantly improve once he is able to discontinue that medication.  The expected trajectory of cognitive improvement in the setting of TBI  was explained to [name], in light of the current findings, and he seemed to understand  the results as presented.  He is encouraged to seek a formal neuropsychological evaluation as an outpatient in 6-12 months, if cognitive symptoms persist.      The following recommendations are also provided.    RECOMMENDATIONS:    Complete a comprehensive neuropsychological evaluation as an outpatient, if cognitive dysfunction persists or worsens.  This could be conducted in 6-12 months and information on a local neuropsychologist should be included in his discharge paperwork.   [name]' memory will be best when information is broken down into its most salient pieces.  He may become overwhelmed with unnecessary details.  Additionally, providing instructions and important information in writing will likely help aid his memory.    When interacting with [name], directions and information should be provided in a simple, straight forward manner, and the treatment team should avoid giving multiple instructions simultaneously.    [name] may also benefit from being provided with multiple trials to learn new skills given the noted memory inefficiencies.    To the extent possible, multitasking should be avoided.   [name] may require more time than typical to process information. Those interacting with him may benefit from waiting for a verbal response to information before presenting additional information.    Performance will generally be best in a structured, routine, and familiar environment, as opposed to situations involving complex problems.    Maintain engagement in mentally, physically and cognitively stimulating activities.    Strive to maintain a healthy lifestyle (e.g., proper diet and exercise) in order to promote physical, cognitive and emotional health.   [name], Psy.D.  Clinical Neuropsychologist
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
## 2014-09-23 ENCOUNTER — Encounter: Payer: Self-pay | Admitting: Physical Medicine & Rehabilitation

## 2014-09-23 ENCOUNTER — Encounter: Payer: Non-veteran care | Attending: Physical Medicine & Rehabilitation | Admitting: Physical Medicine & Rehabilitation

## 2014-09-23 DIAGNOSIS — M5416 Radiculopathy, lumbar region: Secondary | ICD-10-CM

## 2014-09-23 DIAGNOSIS — F918 Other conduct disorders: Secondary | ICD-10-CM

## 2014-09-23 DIAGNOSIS — Z794 Long term (current) use of insulin: Secondary | ICD-10-CM | POA: Diagnosis not present

## 2014-09-23 DIAGNOSIS — R51 Headache: Secondary | ICD-10-CM | POA: Insufficient documentation

## 2014-09-23 DIAGNOSIS — M545 Low back pain: Secondary | ICD-10-CM | POA: Insufficient documentation

## 2014-09-23 DIAGNOSIS — Z87820 Personal history of traumatic brain injury: Secondary | ICD-10-CM | POA: Insufficient documentation

## 2014-09-23 DIAGNOSIS — M25519 Pain in unspecified shoulder: Secondary | ICD-10-CM | POA: Insufficient documentation

## 2014-09-23 DIAGNOSIS — Z79899 Other long term (current) drug therapy: Secondary | ICD-10-CM

## 2014-09-23 DIAGNOSIS — Z5181 Encounter for therapeutic drug level monitoring: Secondary | ICD-10-CM

## 2014-09-23 DIAGNOSIS — E1140 Type 2 diabetes mellitus with diabetic neuropathy, unspecified: Secondary | ICD-10-CM | POA: Insufficient documentation

## 2014-09-23 DIAGNOSIS — M542 Cervicalgia: Secondary | ICD-10-CM | POA: Diagnosis not present

## 2014-09-23 DIAGNOSIS — S069X0S Unspecified intracranial injury without loss of consciousness, sequela: Secondary | ICD-10-CM

## 2014-09-23 DIAGNOSIS — S069XAS Unspecified intracranial injury with loss of consciousness status unknown, sequela: Secondary | ICD-10-CM | POA: Insufficient documentation

## 2014-09-23 DIAGNOSIS — S069X4S Unspecified intracranial injury with loss of consciousness of 6 hours to 24 hours, sequela: Secondary | ICD-10-CM

## 2014-09-23 DIAGNOSIS — R4689 Other symptoms and signs involving appearance and behavior: Secondary | ICD-10-CM

## 2014-09-23 MED ORDER — HYDROCODONE-ACETAMINOPHEN 5-325 MG PO TABS: 1.0000 | ORAL_TABLET | Freq: Two times a day (BID) | ORAL | Status: AC | PRN

## 2014-09-23 MED ORDER — MELOXICAM 15 MG PO TABS: 15.0000 mg | ORAL_TABLET | Freq: Every day | ORAL | Status: AC

## 2014-09-23 MED ORDER — DIVALPROEX SODIUM 500 MG PO DR TAB: 500.0000 mg | DELAYED_RELEASE_TABLET | Freq: Two times a day (BID) | ORAL | Status: AC

## 2014-09-23 NOTE — Patient Instructions (Signed)
PLEASE CALL ME WITH ANY PROBLEMS OR QUESTIONS (#[phone]).      
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
## 2014-09-26 ENCOUNTER — Other Ambulatory Visit: Payer: Self-pay | Admitting: Physical Medicine & Rehabilitation

## 2014-09-27 LAB — PMP ALCOHOL METABOLITE (ETG): ETGU: NEGATIVE ng/mL

## 2014-10-01 LAB — OPIATES/OPIOIDS (LC/MS-MS)
Codeine Urine: NEGATIVE ng/mL (ref <50)
Hydrocodone: NEGATIVE ng/mL — AB (ref <50)
Hydromorphone: 54 ng/mL (ref <50)
Morphine Urine: NEGATIVE ng/mL (ref <50)
Norhydrocodone, Ur: 225 ng/mL (ref <50)
Noroxycodone, Ur: NEGATIVE ng/mL (ref <50)
OXYCODONE, UR: NEGATIVE ng/mL (ref <50)
OXYMORPHONE, URINE: NEGATIVE ng/mL (ref <50)

## 2014-10-01 LAB — CANNABANOIDS (GC/LC/MS), URINE: THC-COOH (GC/LC/MS), ur confirm: 247 ng/mL — AB (ref <5)

## 2014-10-04 LAB — PRESCRIPTION MONITORING PROFILE (SOLSTAS)
Amphetamine/Meth: NEGATIVE ng/mL
Barbiturate Screen, Urine: NEGATIVE ng/mL
Benzodiazepine Screen, Urine: NEGATIVE ng/mL
Buprenorphine, Urine: NEGATIVE ng/mL
COCAINE METABOLITES: NEGATIVE ng/mL
CREATININE, URINE: 80.34 mg/dL (ref >20.0)
Carisoprodol, Urine: NEGATIVE ng/mL
ECSTASY: NEGATIVE ng/mL
Fentanyl, Ur: NEGATIVE ng/mL
Meperidine, Ur: NEGATIVE ng/mL
Methadone Screen, Urine: NEGATIVE ng/mL
Nitrites, Initial: NEGATIVE ug/mL
OXYCODONE SCRN UR: NEGATIVE ng/mL
PH URINE, INITIAL: 5.6 pH (ref 4.5–8.9)
Propoxyphene: NEGATIVE ng/mL
Tapentadol, urine: NEGATIVE ng/mL
Tramadol Scrn, Ur: NEGATIVE ng/mL
ZOLPIDEM, URINE: NEGATIVE ng/mL

## 2014-10-14 ENCOUNTER — Telehealth: Payer: Self-pay | Admitting: *Deleted
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
## 2014-10-26 NOTE — Progress Notes (Signed)
Urine drug screen for this encounter is inconsistent.  Positive for marijuana. 

## 2014-11-23 ENCOUNTER — Encounter: Payer: Non-veteran care | Admitting: Physical Medicine & Rehabilitation

## 2014-12-30 ENCOUNTER — Encounter: Payer: Self-pay | Attending: Physical Medicine & Rehabilitation | Admitting: Physical Medicine & Rehabilitation

## 2015-03-24 ENCOUNTER — Encounter: Payer: Self-pay | Admitting: Physical Medicine & Rehabilitation

## 2015-04-07 IMAGING — CR DG HUMERUS 2V *R*
2 series · 2 of 2 positions shown · non-contrast
Comparison: Chest radiograph - 08/07/2014

CLINICAL DATA: Post fall and hospital bath room today. Now with
distal right humerus pain. Initial encounter.

EXAM:
RIGHT HUMERUS - 2+ VIEW

[t humerus ap right]
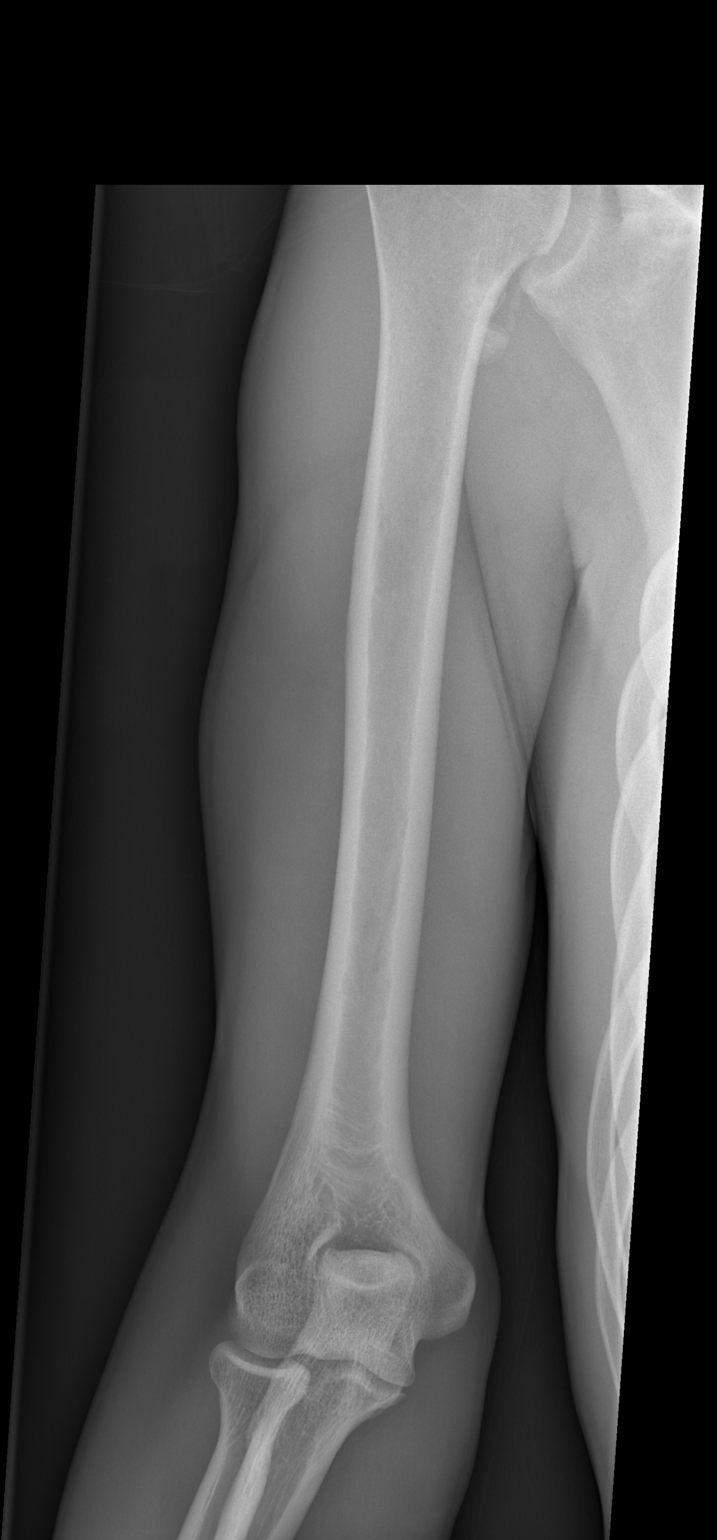

[t humerus lat right]
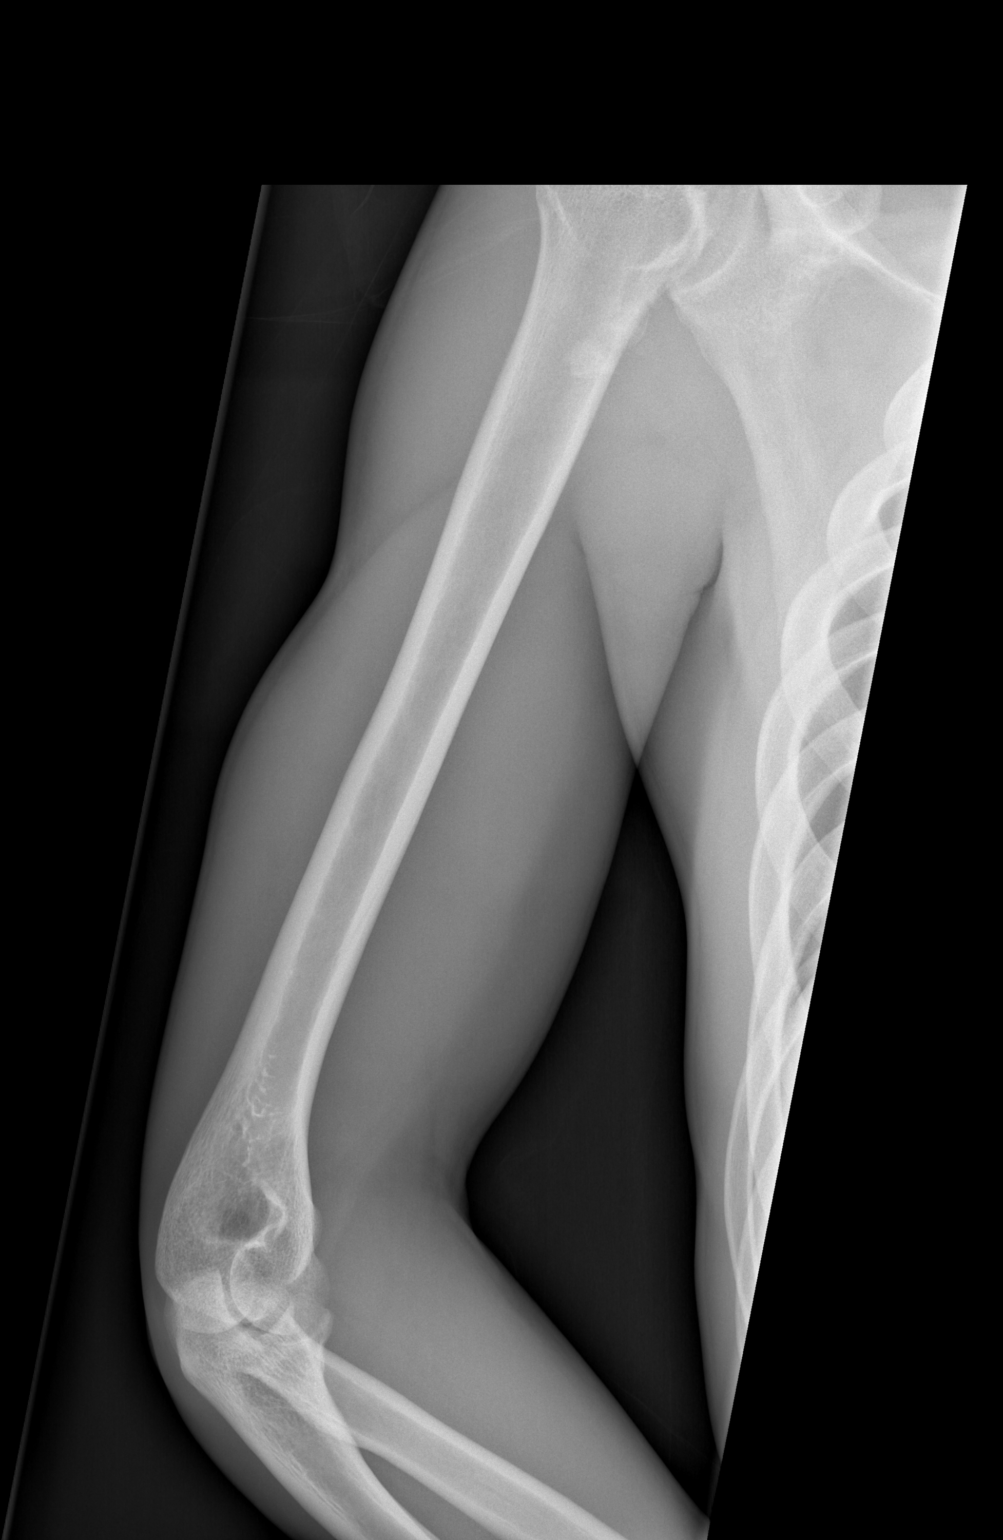

[2 of 2 positions shown; findings below may reference images not displayed]

FINDINGS: Two well corticated ossicles again overlie the expected location of
the inferior aspect of the glenohumeral joint, 1 measuring
approximately 1.5 cm the other measuring approximately 1.3 cm,
similar to prior chest radiograph performed 08/07/2014. Limited
visualization of the adjacent shoulder joint is otherwise normal
given obliquity.

No acutely displaced fracture. Limited visualization of the adjacent
elbow joint is normal given obliquity.

Regional soft tissues appear normal.  No radiopaque foreign body.
IMPRESSION: 1. No acute findings.
2. Unchanged presumed loose bodies overlie the inferior aspect the
right glenohumeral joint as was demonstrated on prior chest
radiograph performed 08/07/2014.

## 2017-10-11 DEATH — deceased
